# Patient Record
Sex: Female | Born: 1960
Health system: Southern US, Community
[De-identification: ages and names within clinical notes are randomized; demographics above are authoritative.]

## PROBLEM LIST (undated history)

## (undated) ENCOUNTER — Ambulatory Visit: Admission: EM

## (undated) DIAGNOSIS — G4700 Insomnia, unspecified: Secondary | ICD-10-CM

## (undated) DIAGNOSIS — G4733 Obstructive sleep apnea (adult) (pediatric): Secondary | ICD-10-CM

## (undated) DIAGNOSIS — R3129 Other microscopic hematuria: Secondary | ICD-10-CM

## (undated) DIAGNOSIS — E559 Vitamin D deficiency, unspecified: Secondary | ICD-10-CM

## (undated) DIAGNOSIS — E785 Hyperlipidemia, unspecified: Secondary | ICD-10-CM

## (undated) HISTORY — PX: PARTIAL HYSTERECTOMY: SHX80

## (undated) HISTORY — DX: Obstructive sleep apnea (adult) (pediatric): G47.33

## (undated) HISTORY — DX: Insomnia, unspecified: G47.00

## (undated) HISTORY — PX: CYST REMOVAL HAND: SHX6279

## (undated) HISTORY — DX: Hyperlipidemia, unspecified: E78.5

## (undated) HISTORY — DX: Other microscopic hematuria: R31.29

## (undated) HISTORY — DX: Vitamin D deficiency, unspecified: E55.9

---

## 1999-12-16 ENCOUNTER — Other Ambulatory Visit: Admission: RE | Admit: 1999-12-16 | Discharge: 1999-12-16 | Payer: Self-pay | Admitting: Orthopedic Surgery

## 2000-02-18 ENCOUNTER — Ambulatory Visit (HOSPITAL_COMMUNITY): Admission: RE | Admit: 2000-02-18 | Discharge: 2000-02-18 | Payer: Self-pay | Admitting: Family Medicine

## 2000-02-18 ENCOUNTER — Encounter: Payer: Self-pay | Admitting: Family Medicine

## 2001-11-30 ENCOUNTER — Encounter: Payer: Self-pay | Admitting: Emergency Medicine

## 2001-11-30 ENCOUNTER — Emergency Department (HOSPITAL_COMMUNITY): Admission: EM | Admit: 2001-11-30 | Discharge: 2001-11-30 | Payer: Self-pay | Admitting: Emergency Medicine

## 2003-11-20 ENCOUNTER — Encounter (INDEPENDENT_AMBULATORY_CARE_PROVIDER_SITE_OTHER): Payer: Self-pay | Admitting: Specialist

## 2003-11-20 ENCOUNTER — Inpatient Hospital Stay (HOSPITAL_COMMUNITY): Admission: RE | Admit: 2003-11-20 | Discharge: 2003-11-22 | Payer: Self-pay | Admitting: Obstetrics and Gynecology

## 2004-09-23 ENCOUNTER — Other Ambulatory Visit: Admission: RE | Admit: 2004-09-23 | Discharge: 2004-09-23 | Payer: Self-pay | Admitting: Obstetrics and Gynecology

## 2007-06-18 ENCOUNTER — Other Ambulatory Visit: Admission: RE | Admit: 2007-06-18 | Discharge: 2007-06-18 | Payer: Self-pay | Admitting: Obstetrics and Gynecology

## 2008-12-07 ENCOUNTER — Other Ambulatory Visit: Admission: RE | Admit: 2008-12-07 | Discharge: 2008-12-07 | Payer: Self-pay | Admitting: Obstetrics and Gynecology

## 2009-08-19 IMAGING — US MAMMO-LUNI-US
1 series · 14 of 16 positions shown · non-contrast
Comparison: NONE

CLINICAL DATA: [facility]. 

LEFT BREAST MAMMOGRAM ADDITIONAL VIEWS AND LEFT BREAST ULTRASOUND

[Series 1: us left breast · 0.05mm/px · 14 of 16 slices shown]
[im 1/16]
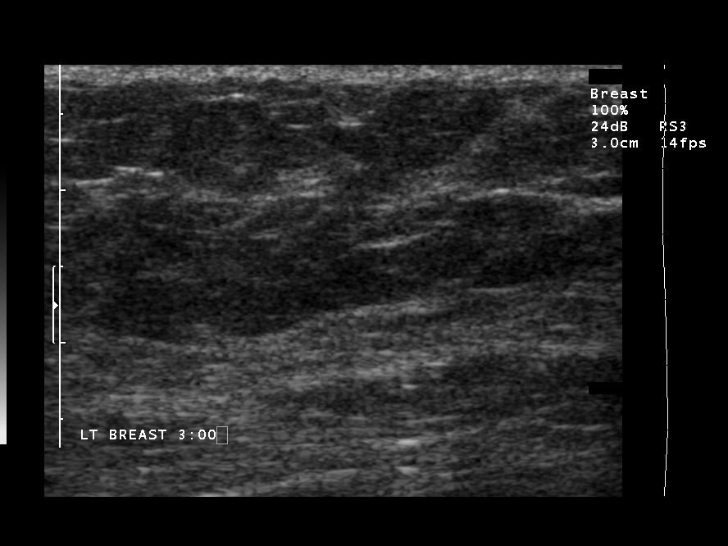
[im 2/16]
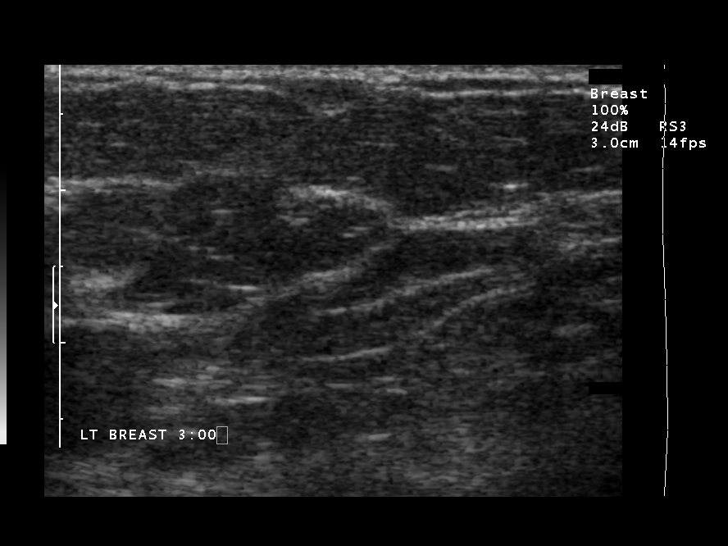
[im 3/16]
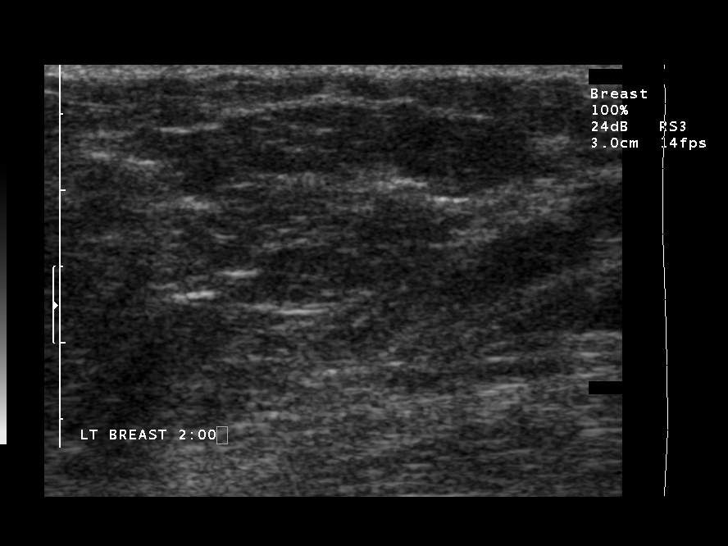
[im 5/16]
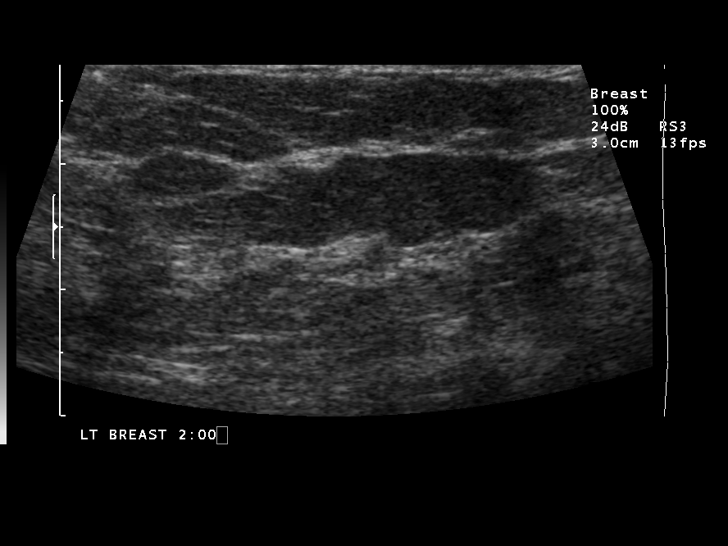
[im 6/16]
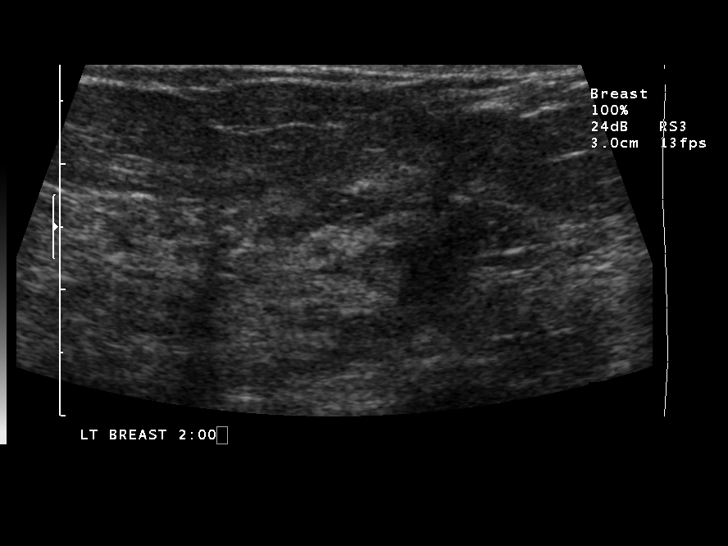
[im 7/16]
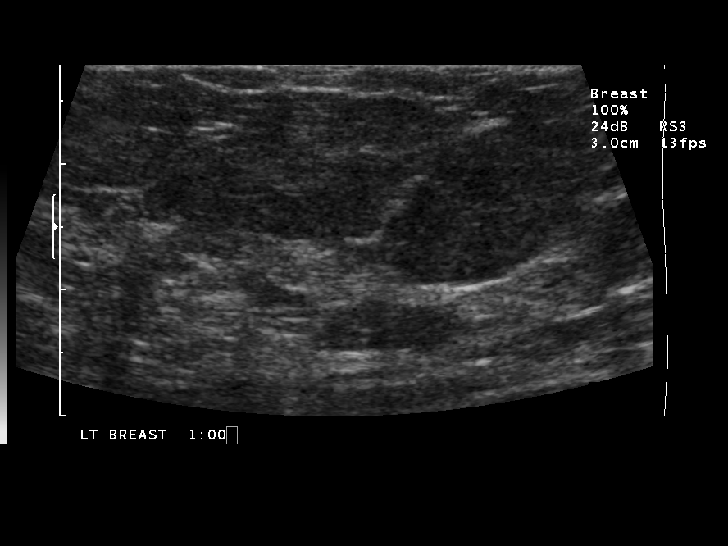
[im 8/16]
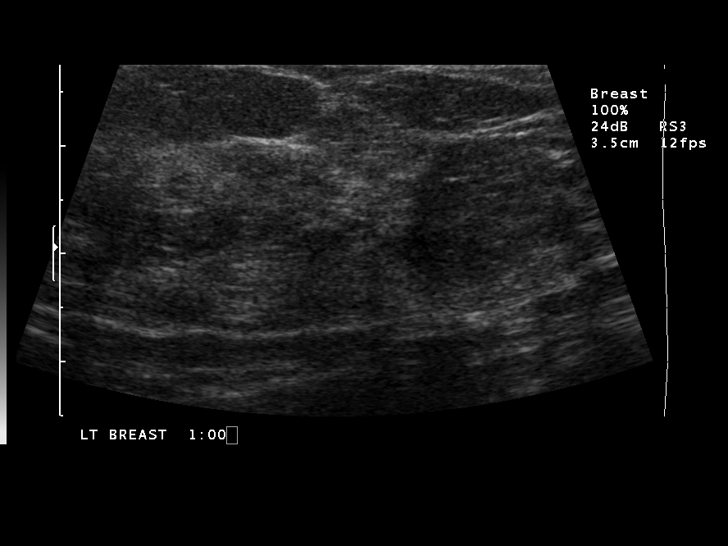
[im 9/16]
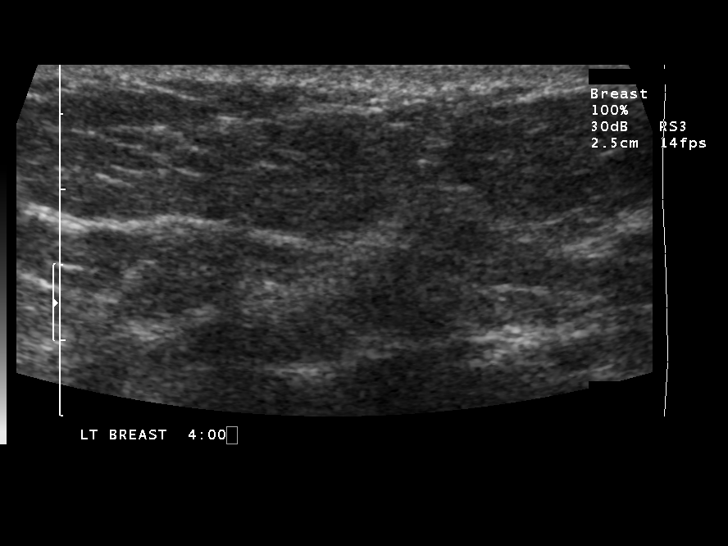
[im 10/16]
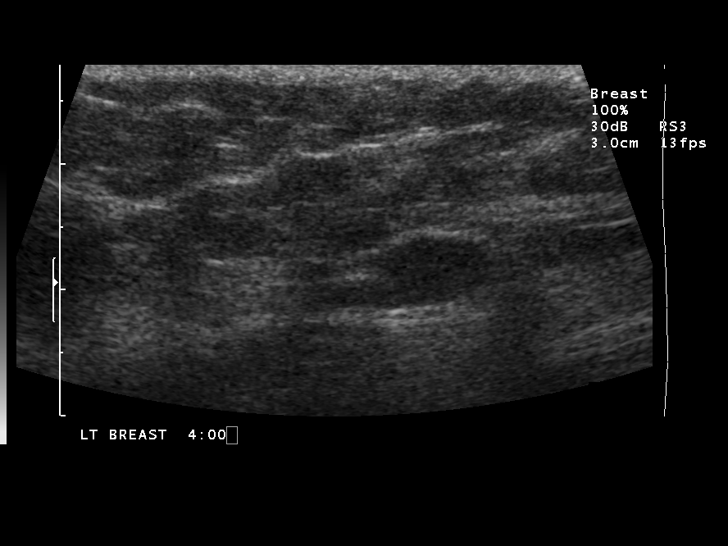
[im 11/16]
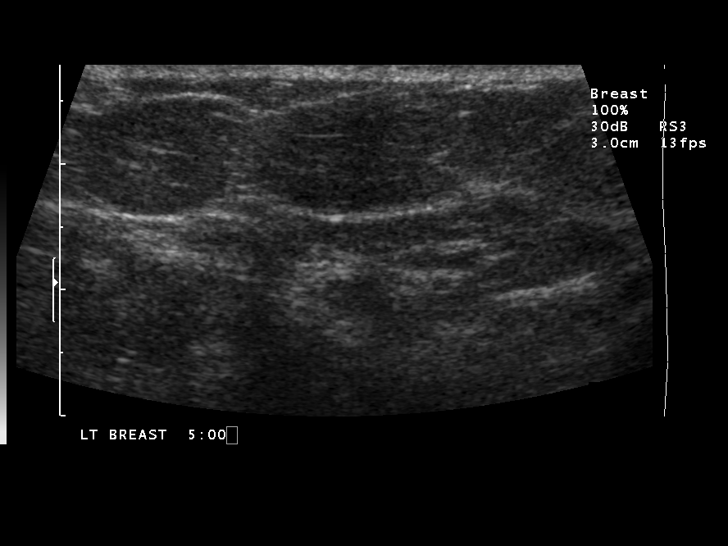
[im 13/16]
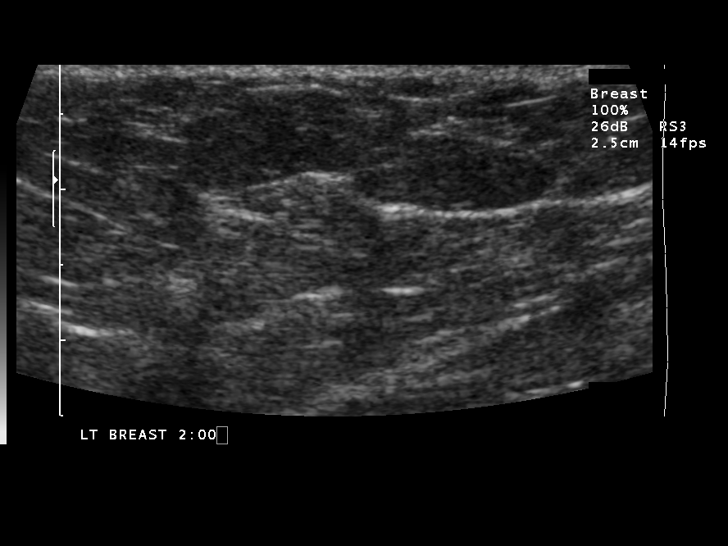
[im 14/16]
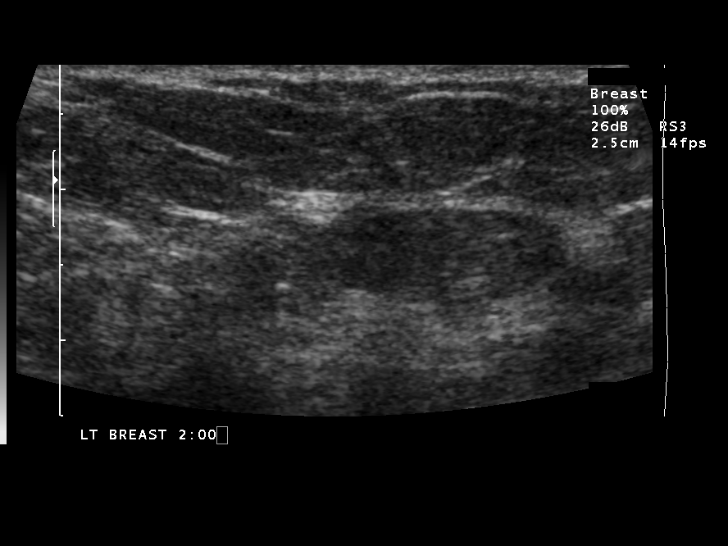
[im 15/16]
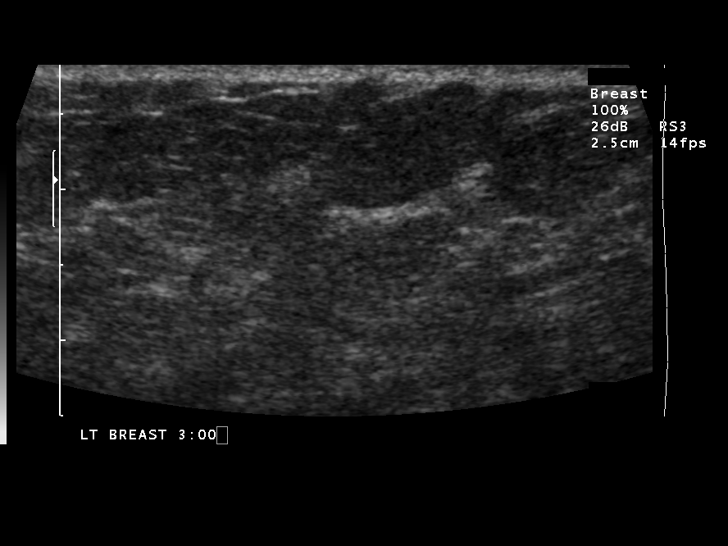
[im 16/16]
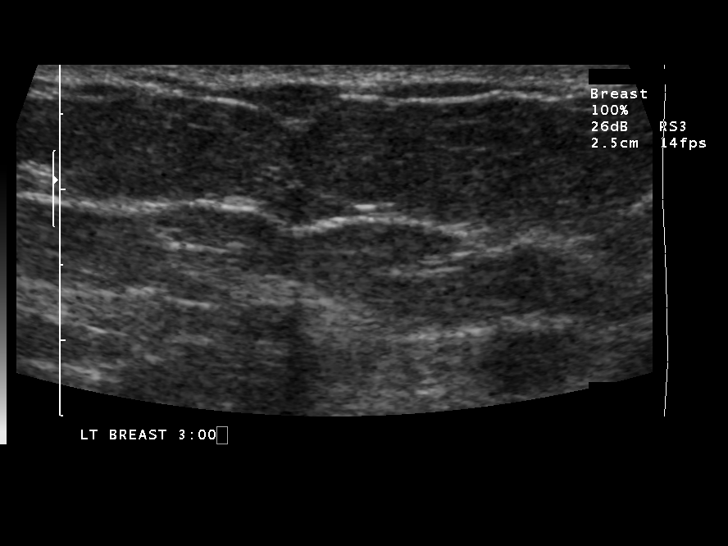

[14 of 16 positions shown; findings below may reference images not displayed]

FINDINGS: Spot craniocaudal, repeat craniocaudal, and repeat 
oblique views of the left breast were performed.  The previously 
noted spiculated nodular density is not present on the additional 
views consistent with summation artifact. Ultrasound evaluation of 
the outer quadrants of the left breast demonstrate a normal 
fibroglandular echo pattern.
IMPRESSION: Additional evaluation of the left breast clears the 
area of concern.  Annual mammographic screening is now 
recommended. The patient was informed at the time of the 
examination of the findings and recommendations by verbal and 
written lay report. Computer assisted (Second Look) technology was 
used as an aid in interpretation of this study. BI-RADS 1: 
Negative. Velezpaduaedwin Luyanda, M.D. electronically reviewed on 
08/18/2007 Dict Date: 08/18/2007  Tran Date: 08/18/2007 [facility]

## 2009-11-12 ENCOUNTER — Other Ambulatory Visit: Admission: RE | Admit: 2009-11-12 | Discharge: 2009-11-12 | Payer: Self-pay | Admitting: Obstetrics and Gynecology

## 2010-11-12 ENCOUNTER — Other Ambulatory Visit (HOSPITAL_COMMUNITY)
Admission: RE | Admit: 2010-11-12 | Discharge: 2010-11-12 | Disposition: A | Discharge status: Home / Self Care | Payer: BC Managed Care – PPO | Source: Ambulatory Visit | Attending: Obstetrics and Gynecology | Admitting: Obstetrics and Gynecology

## 2010-11-12 ENCOUNTER — Other Ambulatory Visit: Payer: Self-pay | Admitting: Obstetrics and Gynecology

## 2010-11-12 DIAGNOSIS — Z01419 Encounter for gynecological examination (general) (routine) without abnormal findings: Secondary | ICD-10-CM | POA: Insufficient documentation

## 2010-12-27 NOTE — Consult Note (Signed)
NAME:  Jasmin Pugh, Jasmin Pugh                          ACCOUNT NO.:  [id number]   MEDICAL RECORD NO.:  [id number]                   PATIENT TYPE:  INP   LOCATION:  9303                                 FACILITY:  [facility]   PHYSICIAN:  Lesleigh Noe, M.D.            DATE OF BIRTH:  [birth year]   DATE OF CONSULTATION:  11/20/2003  DATE OF DISCHARGE:                                   CONSULTATION   INDICATION:  Frequent PVCs.   CONCLUSIONS:  1. Frequent uniform premature ventricular contractions which appear benign     are likely secondary to significant hypokalemia.  2. Hypokalemia, etiology uncertain.  3. Total abdominal hysterectomy.   RECOMMENDATIONS:  1. Repletion of potassium.  2. No further cardiac evaluation unless PVCs persist despite potassium     repletion.   COMMENTS:  The patient is 50 and has no history of cardiovascular disease.  No history of coronary disease.  Her mother who is in her 62s does have a  history of CAD and has had angioplasty.  She is currently status post  hysterectomy today and in recovery was noted to have frequent uniform PVCs.  In speaking with her at this point she has no chest discomfort, shortness of  breath, or other cardiopulmonary symptoms.  She has never had syncope.  No  history of tachypalpitations.  Her laboratory data prior to surgery done on  November 17, 2002 revealed a potassium of 3.9 and all of her other laboratory  data was normal.  Her repeat potassium today demonstrated a level of 3.1, a  magnesium level of 1.5; other laboratory data was normal.   MEDICATIONS:  1. P.r.n. Ambien for sleep.  2. Ortho Evra patch   ALLERGIES:  SULFA.   FAMILY HISTORY:  Positive for coronary artery disease with her mother having  history of angioplasties.  She has three siblings, all in good health.  Her  father is age 45 and has history of hypertension and diabetes.   HABITS:  She does not smoke or drink.   PHYSICAL EXAMINATION:  GENERAL:  The patient  is still groggy from  anesthesia.  VITAL SIGNS:  Blood pressure is 110/70, heart rate is 80.  Monitoring  reveals normal sinus rhythm.  A rare PVC is noted.  NECK:  No JVD is noted.  Thyroid is not palpable.  LUNGS:  Clear to auscultation and percussion.  CARDIAC:  No murmur, no gallop, no click, no rub.  EXTREMITIES:  Reveal no edema.   EKG reveals nonspecific T wave flattening, PVCs that are uniform.  Potassium  3.1, magnesium 1.5.   DISCUSSION:  I believe this is as simple as the patient's potassium level  being low.  This needs to be repleted.  I do not feel that she needs to have  telemetry monitoring.  Will give 40 mEq of K-Dur now, repeat it in about 4  hours.  Repeat potassium later  this evening.  Get EKG in the morning.  No  further cardiac evaluation seems indicated.                                               Lesleigh Noe, M.D.    HWS/MEDQ  D:  11/20/2003  T:  11/20/2003  Job:  [id number]

## 2010-12-27 NOTE — Op Note (Signed)
NAME:  Jasmin Pugh, Jasmin Pugh                          ACCOUNT NO.:  [id number]   MEDICAL RECORD NO.:  [id number]                   PATIENT TYPE:  INP   LOCATION:  9399                                 FACILITY:  [facility]   PHYSICIAN:  Artist Pais, M.D.                 DATE OF BIRTH:  [birth year]   DATE OF PROCEDURE:  11/20/2003  DATE OF DISCHARGE:                                 OPERATIVE REPORT   PREOPERATIVE DIAGNOSIS:  Enlarging fibroid uterus despite treatment with  Ortho-Evra patch.   POSTOPERATIVE DIAGNOSES:  1. Enlarging fibroid uterus despite treatment with Ortho-Evra patch.  2. Left bowel adherent to the left ovary.   PROCEDURES:  1. Total abdominal hysterectomy.  2. Lysis of adhesions.   SURGEON:  Artist Pais, M.D.   ASSISTANT:  Bing Neighbors. Sydnee Cabal, M.D.   Arline AspLurline Idol.   FLUIDS REPLACED:  2200 mL crystalloid plus 2 g of IV Cefazolin  preoperatively.   ESTIMATED BLOOD LOSS:  250 mL.   DRAINS:  Foley.   COMPLICATIONS:  None.   DESCRIPTION OPERATION:  The patient was brought to the operating room,  identified on the operating table.  After induction of adequate general  endotracheal anesthesia, the patient was prepped and draped in the usual  sterile fashion.  An examination under anesthesia revealed a fibroid to be  palpable posteriorly and the uterus and fibroid together palpated to be  approximately 12-14 weeks' size.  It was felt that a Pfannenstiel incision  would provide adequate room to perform the hysterectomy.  A Foley catheter  had been placed.  A Pfannenstiel incision was made and carried down to  fascia.  The fascia was scored in the midline, extended bilaterally using  Mayo scissors.  It was then separated free from the underlying muscles.  The  muscles were separated in the midline down to the symphysis.  The patient  was placed in Trendelenburg and the peritoneum was entered sharply and  carefully, taking care to avoid bowel or other abdominal  contents.  The  peritoneal incision was extended superiorly, then inferiorly down to the  bladder edge.  Examination of the peritoneal contents revealed there to be  no enlarged pelvic or periaortic nodes.  There was noted to be some adhesive  disease of the left colon to the left ovary and infundibulopelvic ligament.  Subsequently an O'Connor-O'Sullivan was placed and the bladder blade was  placed.  Using two bowel packs, the bowel was packed out of the operative  field and the abdominal wall retractor was placed.  Two long Kellys were  placed at either angle of the uterus.  The large pedunculated fibroid was  noted to emanate off the left posterior superior portion of the uterus at  the fundal portion.  It was on a very vascular and large stalk and was  adjacent to the left tube and ovary.  The right round ligament was  subsequently identified, ligated with a figure-of-eight suture of 0  Monocryl, and divided using cautery.  The anterior leaf of the broad  ligament was divided to the area overlying the internal os.  A clear space  was developed and the fallopian tube and infundibulopelvic ligament were  clamped with a Heaney clamp, cut, and suture ligated x2 with two sutures of  0 Monocryl.  Excellent hemostasis was noted at this pedicle.  It had been  noted that both ovaries appeared normal and tubes appeared normal as well.  In a similar fashion the left round ligament was ligated with a figure-of-  eight suture of 0 Monocryl, divided with cautery, and the anterior leaf of  the broad ligament was divided using Metzenbaum scissors to the area  overlying the internal os.  The clear space was developed and the fallopian  tube and utero-ovarian ligament were clamped, cut, and doubly suture ligated  with sutures of 0 Monocryl.  Subsequently the vessels were skeletonized on  the right.  The bladder flap was mobilized and taken down using sharp and  blunt dissection.  It was noted to be  well-mobilized.  After the vessels on  the right were mobilized, the uterine vessels were clamped with a curved  Heaney, with another Heaney used for backbleeding, cut and suture ligated  using a figure-of-eight suture of 0 Monocryl.  The same was accomplished on  the left in a similar fashion.  Subsequent cardinal-uterosacral ligament  bites were taken bilaterally with straight Heaneys, cut and suture ligated  using sutures of 0 Monocryl.  Again the bladder flap was noted to be well-  mobilized.  Using a curved Heaney the left uterosacral ligament was clamped,  cut, and suture ligated using a suture of 0 Monocryl, and the vagina was  entered.  Subsequently Satinsky scissors were used to separate the cervix  from the vagina and Kocher clamps were placed on the vagina.  An angle  suture was placed at the right and left angles of the vaginal cuff, and this  was done in multiple throws to promote hemostasis.  Figure-of-eight sutures  of 0 Monocryl were then placed to close the cuff, taking great care to  ensure that all of the mucosa was included in the closure.  Subsequently the  pelvis was copiously irrigated with warm lactated Ringer's.  We were able to  see both ureters peristalsing normally.  The right ovary was suspended using  a suture of 2-0 Monocryl back to the round ligament to decrease the risk of  dyspareunia later.  The left ovary was also suspended in a similar fashion,  but this was after the bowel adhesions to the left ovary and left  infundibulopelvic ligament were taken down.  There was noted to be some  bleeding at this area, but this was clamped with a right angle and tied with  a free tie of 2-0 Monocryl.  Excellent hemostasis was noted where the  adhesions had been lysed.  The adhesion had to be lysed because there was a  portal which could have entrapped one of the bowel loops, thus causing a bowel obstruction, so we had to go ahead and lyse this adhesion.  There was   noted to be a small amount of oozing at the right ovarian ligament  suspension, and I clamped this with a right angle and then tied this with a  free tie of 2-0 Monocryl.  Subsequently the entire pelvis was copiously  irrigated with excellent hemostasis  noted at all the pedicle sites; however,  we did note a small amount of oozing near the vaginal cuff, but retraction  of the bladder flap reveals this to be over the adventitial tissue and  excellent hemostasis of this area was achieved using cautery.  It should be  noted that all cut pedicles and raw surfaces were very carefully inspected  and when we began to close the peritoneum, there was absolutely no oozing at  all.  Subsequently the patient was taken out of Trendelenburg, the bowel  packs were removed, the O'Connor-O'Sullivan retractor as well as the  abdominal wall retractor and the bladder blade were removed.  Hemostats were  placed on the peritoneum and the peritoneum was closed using a simple  running suture of 2-0 Monocryl.  There was noted to be some oozing of a  perforator of the fascia, but I just locked the suture and this bleeding was  subsequently controlled.  The fascia was then irrigated copiously with warm  lactated Ringer's and excellent hemostasis was achieved using cautery.  There were noted to be absolutely no subfascial bleeders.  The fascia was  closed using two sutures of 0 PDS with each suture anchored at either angle  of the incision and run to the midline and tied.  The subcutaneous tissue  was irrigated copiously with warm lactated Ringer's and excellent hemostasis  was achieved using cautery.  The skin was closed with staples.  The patient  tolerated the procedure well without apparent complications and was  transferred to the recovery room in stable condition after all instrument,  sponge, and needle counts were correct.  It should be noted that when we  lysed the adhesion of the bowel, we did this along the  adhesion and not near  the bowel viscus at all, and the bowel was inspected and noted to have not  been in any way compromised and appeared totally and completely normal.  This was just done for completeness' sake.                                               Artist Pais, M.D.    DC/MEDQ  D:  11/20/2003  T:  11/20/2003  Job:  [id number]   cc:   Sharlet Salina, M.D.  [address]  [place]  [place] [address]  Fax: [phone]

## 2010-12-27 NOTE — H&P (Signed)
NAME:  Jasmin Pugh, Jasmin Pugh                          ACCOUNT NO.:  [id number]   MEDICAL RECORD NO.:  [id number]                   PATIENT TYPE:  INP   LOCATION:  NA                                   [facility]   PHYSICIAN:  Artist Pais, M.D.                 DATE OF BIRTH:  11/02/1960   DATE OF ADMISSION:  11/20/2003  DATE OF DISCHARGE:                                HISTORY & PHYSICAL   HISTORY OF PRESENT ILLNESS:  The patient is a 50 year old African American  female, para 0, admitted for a total abdominal hysterectomy.  The patient  was originally referred by Sharlet Salina, M.D., for GYN care and for  followup of a fibroid uterus.  She has been followed since May of 2002 for  her fibroid uterus.  Originally at that time, her largest fibroid was found  to measure 5.1 x 3.5 x 5.6.  She has been managed expectantly, being treated  with Ortho Evra patches to decrease the risk of growth of the fibroid.  With  respect to her most recent pelvic ultrasound, she was found to have some new  fibroids.  The uterus measured 7.2 x 4.0 x 4.5 cm.  She, however, was found  to have a growth in her pedunculated fibroid such that it measures 61 x 51 x  68 mm.  On May 30, 2003, it measured 61 x 59 mm and on February 21, 2002, it  measured 5.2 x 4.1 x 5.1 cm.  So the most recent measurements show that this  has increased in size from 5.2 x 4.1 x 5.1 initially to 6.1 x 5.1 x 6.8 cm.  I explained to the patient that this is concerning from some sort of  sarcomatous change, although it is not growing rapidly.  I do not think it  is a sarcoma, but an indication for hysterectomy with respect to fibroid  uterus with the continue growth of the fibroid despite suppression of the  hypothalamic, pituitary, and ovarian axis with Ortho Eva patches.  The  patient has no plans for children and thus is amenable with hysterectomy.  I  could offer her no guarantees that I would be able to do a myomectomy  without  compromising the uterine blood supply or the integrity of the uterus  and I might not be able to leave her with an intact uterus.  I did offer her  to be watched for four more months, but I really thought the prudent course  would be to proceed with hysterectomy as she has been managed expectantly  since 2002.  The patient was allowed to think about her options and decided  to undergo hysterectomy.  She does desire to keep her ovaries if they look  normal and we agreed that we would like them in situ if they appear normal.  However, if they appear abnormal, I would go ahead  and remove them, but  agree that at age 45 we would like to not oophorectomize her unless  absolutely necessary.  She does understand if I remove her ovaries that she  would become instantly menopausal, a condition which we can treat with  hormone replacement therapy and if we remove her ovaries it does decrease,  but does not eliminate her risk of developing ovarian cancer.  After  discussion of the risks versus benefits, she does desire to keep her  ovaries.  The risks of surgery, including anesthetic complication,  hemorrhage, infection, and damage to adjacent structures, including bladder,  bowel, blood vessel, and ureter, were discussed with the patient.  She was  made aware that the postoperative risks include urinary tract infection,  atelectasis, wound infections, or DVT which could result in a stroke or  pulmonary embolism.  She understands that there could also be unforeseen  risks.  I did explain to her that a fibroid uterus very dramatically  distorts the anatomy in the pelvis and there is I think always an increased  risk of damage to the ureter, the bladder, the bowel, or any other adjacent  structures.  I did spent a considerable amount of time explaining the  distortion of the anatomy by the fibroid uterus and explained if  complication were to occur it would likely be due to that reason.  The  patient  expressed and understanding of and acceptance of her risks and  desires to proceed with surgery.  The patient has discontinued her Ortho  Evra patch and has not taken any aspirin or any NSAID-type medications for  the last two weeks.   OBSTETRICAL AND GYNECOLOGICAL HISTORY:  History of fibroid uterus as above.  The patient does have cycles every eight weeks as she does back-to-back  patches.  She did have an abnormal Pap and cryosurgery in 1995.  Paps have  been normal since that time.  In fact, her most recent Pap on January 26, 2003,  was within normal limits.   PAST MEDICAL HISTORY:  History of seasonal allergies.   PAST SURGICAL HISTORY:  1. Knee surgery, that is repair of ACL.  2. Removal of ganglion cyst x 2.   MEDICATIONS:  Ambien and Ortho Evra patch.  The Ortho Evra patch has been  discontinued for the last two weeks.   ALLERGIES:  SULFA causes rash, fever, diarrhea, and hives.   FAMILY HISTORY:  There is no family history of breast, ovarian, or prostate  cancer.  The patient's father is 39 with hypertension and diabetes.  Her  mother is 49 with a history of two angioplasties.  She has three siblings,  ages 47, 21, and 52, all alive and well.  She has no children.   SOCIAL HISTORY:  The patient works for [employer].  She does not smoke  or drink.   REVIEW OF SYSTEMS:  Noncontributory, except as noted above.  Denies  headache, visual changes, chest pain, shortness of breath, abdominal pain,  change in bowel habits, unintentional weight loss, dysuria, urgency,  frequency, vaginal pruritus or discharge, and pain or bleeding with  intercourse.   PHYSICAL EXAMINATION:  GENERAL APPEARANCE:  A well-developed African  American female.  VITAL SIGNS:  Blood pressure 102/76, heart rate 80.  WEIGHT:  144 pounds.  HEENT:  Normal.  NECK:  Supple without thyromegaly, adenopathy, or nodules.  CHEST:  Clear to auscultation. BREASTS:  Symmetrical without masses, dimpling,  retraction, or nipple  discharge.  CARDIAC:  Regular rate and rhythm without extra sounds or murmurs.  ABDOMEN:  Soft and nontender.  No hepatosplenomegaly or masses.  EXTREMITIES:  No cyanosis, clubbing, or edema.  NEUROLOGIC:  Oriented x 3.  Grossly normal.  PELVIC:  Normal female external genitalia.  No vulvar, vaginal, or cervical  lesions.  Bimanual examination reveals the uterus to be retroverted and I am  able to palpate her large fibroid posteriorly.  The uterus in its entirety  palpates to be a 12-14 week size.  No adnexal masses palpated.  RECTAL:  Excellent sphincter tone.  Confirms pelvic exam.  No masses  palpated.   IMPRESSION AND PLAN:  The patient is a 50 year old African American female  with a pedunculated fibroid, as well as other fibroid, admitted for  hysterectomy due to enlargement of her pedunculated fibroid.  She has been  managed expectantly since 2002 and ultrasounds have revealed no  hydronephrosis.  However, due to the increased size of her fibroid, the  patient is admitted for a total abdominal hysterectomy.  The risks have been  explained to the patient and she expresses understanding of and acceptance  of these risks.  Of note, I have received the patient's laboratory studies,  which reveal a negative urine pregnancy test, negative urinalysis with the  exception of a small amount of urine hemoglobin.  Her sodium is 136,  potassium 3.9, chloride 101, glucose 88, BUN 10, and creatinine 0.8.  The PT  is 12.5 and the PTT is 31.  We did do a whole clot due to her fibroid  uterus.  Her white count is 6900 and the hemoglobin is 12.1 with a platelet  count of 246,000.                                               Artist Pais, M.D.    DC/MEDQ  D:  11/19/2003  T:  11/19/2003  Job:  [id number]

## 2010-12-27 NOTE — Discharge Summary (Signed)
NAME:  Jasmin Pugh, Jasmin Pugh                          ACCOUNT NO.:  [id number]   MEDICAL RECORD NO.:  [id number]                   PATIENT TYPE:  INP   LOCATION:  9303                                 FACILITY:  [facility]   PHYSICIAN:  Artist Pais, M.D.                 DATE OF BIRTH:  [birth year]   DATE OF ADMISSION:  11/20/2003  DATE OF DISCHARGE:  11/22/2003                                 DISCHARGE SUMMARY   HISTORY OF PRESENT ILLNESS:  The patient is a 51 year old African-American  female para 0 admitted for a total abdominal hysterectomy due to increasing  fibroid.  Initially, the pedunculated fibroid measured 5.2 x 4.1 x 5.1 cm  and subsequently increased in size over a period of a year-and-a-half to 6.1  x 5.1 x 6.8 cm.  For further details please see the dictated History and  Physical.   HOSPITAL COURSE:  The patient was admitted and subsequently underwent a  total abdominal hysterectomy.  Tubes and ovaries appeared normal.  For  further details please see the operative report.  Postoperatively, she was  found to have some ectopy in the recovery room and we did check her  potassium.  She was found to be hypokalemic with a potassium of 3.1.  She  was seen by Dr. Garnette Scheuermann in cardiology who really thought the etiology was  hypokalemia and her potassium was repleted with resolution of her ectopy.  On the night of postoperative day #0 she was noted to have minimal bleeding  and also to be receiving excellent analgesia.  Dressing was noted to be  clean, dry, and intact.  No etiology was found for the hypokalemia.  She did  not have any nausea and vomiting the weekend prior to the surgery.  On  postoperative day #1 her hemoglobin was noted to be 9.3 with a white count  of 11,400.  Repeat potassium was found to be normal at 4.2 after repletion.  On postoperative day #2 the patient had passed flatus, was tolerating a  regular diet.  Incision was noted to be clean, dry, and intact.  There was  no  CVA tenderness and no calf tenderness.  Her hemoglobin improved to 10.5.  She was given extensive discharge instructions and a prescription for  Percocet dispense #30 one to two p.o. q.3-4h. p.r.n. pain.  She will take  Aleve per pharmacy directions and she was urged to return to the office in a  few days on postoperative day #7 to have her staples removed.  She was also  urged to call with any problems and was given written discharge  instructions.  She was also given the Recovering From Your Surgery booklet  as well.  She was urged to keep her incision clean, dry, and intact and call  for any problems.  Her pathology returned as endometrial polyp with simple  hyperplasia without atypia, no atypical features or  carcinoma identified;  nonpolypoid endometrium as well; adenomyosis; and a 6 x 6 x 5.5 pedunculated  subserosal leiomyoma of the left cornua.  The patient will return in a few  days for staple removal.                                               Artist Pais, M.D.    DC/MEDQ  D:  01/03/2004  T:  01/04/2004  Job:  [id number]   cc:   Sharlet Salina, M.D.  [address]  [place]  [place] [address]  Fax: [phone]

## 2011-07-07 DIAGNOSIS — N94819 Vulvodynia, unspecified: Secondary | ICD-10-CM | POA: Insufficient documentation

## 2011-07-07 DIAGNOSIS — R102 Pelvic and perineal pain: Secondary | ICD-10-CM | POA: Insufficient documentation

## 2011-07-07 DIAGNOSIS — N3010 Interstitial cystitis (chronic) without hematuria: Secondary | ICD-10-CM | POA: Insufficient documentation

## 2011-07-07 DIAGNOSIS — R109 Unspecified abdominal pain: Secondary | ICD-10-CM | POA: Insufficient documentation

## 2013-05-07 ENCOUNTER — Other Ambulatory Visit: Payer: Self-pay | Admitting: Cardiology

## 2013-05-07 DIAGNOSIS — Z79899 Other long term (current) drug therapy: Secondary | ICD-10-CM

## 2013-05-07 DIAGNOSIS — E7800 Pure hypercholesterolemia, unspecified: Secondary | ICD-10-CM

## 2013-05-24 ENCOUNTER — Other Ambulatory Visit (INDEPENDENT_AMBULATORY_CARE_PROVIDER_SITE_OTHER): Payer: BC Managed Care – PPO

## 2013-05-24 DIAGNOSIS — E7800 Pure hypercholesterolemia, unspecified: Secondary | ICD-10-CM

## 2013-05-24 DIAGNOSIS — Z79899 Other long term (current) drug therapy: Secondary | ICD-10-CM

## 2013-05-24 LAB — ALT: ALT: 18 U/L (ref 0–35)

## 2013-05-25 LAB — NMR LIPOPROFILE WITH LIPIDS
Cholesterol, Total: 152 mg/dL (ref <200)
HDL Particle Number: 33.1 umol/L (ref >=30.5)
HDL Size: 9.1 nm — ABNORMAL LOW (ref >=9.2)
HDL-C: 53 mg/dL (ref >=40)
LDL (calc): 81 mg/dL (ref <100)
LDL Particle Number: 1111 nmol/L — ABNORMAL HIGH (ref <1000)
LDL Size: 21.2 nm (ref >20.5)
LP-IR Score: 25 (ref <=45)
Large HDL-P: 7.7 umol/L (ref >=4.8)
Large VLDL-P: 1.5 nmol/L (ref <=2.7)
Small LDL Particle Number: 539 nmol/L — ABNORMAL HIGH (ref <=527)
Triglycerides: 88 mg/dL (ref <150)
VLDL Size: 41.1 nm (ref <=46.6)

## 2013-05-27 ENCOUNTER — Telehealth: Payer: Self-pay | Admitting: Cardiology

## 2014-03-23 ENCOUNTER — Ambulatory Visit (INDEPENDENT_AMBULATORY_CARE_PROVIDER_SITE_OTHER): Payer: BC Managed Care – PPO | Admitting: Cardiology

## 2014-03-23 ENCOUNTER — Encounter: Payer: Self-pay | Admitting: Cardiology

## 2014-03-23 VITALS — BP 102/80 | HR 65 | Ht 59.0 in | Wt 146.8 lb

## 2014-03-23 DIAGNOSIS — R002 Palpitations: Secondary | ICD-10-CM

## 2014-03-23 DIAGNOSIS — Z8249 Family history of ischemic heart disease and other diseases of the circulatory system: Secondary | ICD-10-CM

## 2014-03-23 DIAGNOSIS — R252 Cramp and spasm: Secondary | ICD-10-CM

## 2014-03-23 DIAGNOSIS — E7800 Pure hypercholesterolemia, unspecified: Secondary | ICD-10-CM

## 2014-03-23 NOTE — Patient Instructions (Signed)
The current medical regimen is effective;  continue present plan and medications.  Follow up in 1 year with Dr Skains.  You will receive a letter in the mail 2 months before you are due.  Please call us when you receive this letter to schedule your follow up appointment.  

## 2014-03-23 NOTE — Progress Notes (Signed)
[place]. [address] [place], [place]  [address] Phone: [phone] Fax:  [phone]  Date:  03/23/2014   ID:  Gypsy, Kellogg 30-Sep-1960, MRN [id number]  PCP:  Reginia Naas, MD   History of Present Illness: Jasmin Pugh is a 53 y.o. female with mild obstructive sleep apnea here for follow up of palpitations. Her mother has had 4 angioplasties and her father died of myocardial infarction at age 12. She also has a maternal uncle who died of an MI in his late 50s. Her stress level has been increased secondary to layoffs. An EKG was performed on 03/18/10 which showed ventricular bigeminy.  She ended up undergoing a Holter monitor 8/11 which showed frequent PVCs, 3000, an echocardiogram showed normal EF with trace mitral regurgitation and an exercise tolerance test showed 7 minutes and 17 seconds of exercise. Because of her palpitations/PVCs, metoprolol was started but subsequently, she was doing well and we decided to discontinue the metoprolol. She has been doing well since then.  In regards to her cholesterol, she is on pravastatin. [employer], [place].D. is assisting with management. NMR.  Leg cramps have improved. Been taking over the counter KCL. Gatoraide. She has battled with toe cramps most of her life but now leg cramps. It occurred at the movies when she crossed her legs. She drinks Gatorade to try to help with this. She is on pravastatin but denies any muscle weakness or pain. She has not tolerated several of the statins.    Wt Readings from Last 3 Encounters:  03/23/14 146 lb 12.8 oz (66.588 kg)     Past Medical History  Diagnosis Date  . Vitamin D deficiency disease   . Insomnia   . Obstructive sleep apnea     mild   . Microscopic hematuria     complete work up neg. for malignancy 06/2011  . Hyperlipidemia     Past Surgical History  Procedure Laterality Date  . Cyst removal hand      02/2000  . Partial hysterectomy      Current  Outpatient Prescriptions  Medication Sig Dispense Refill  . calcium-vitamin D 250-100 MG-UNIT per tablet Take 1200 mg as directed      . Coenzyme Q10 (CO Q 10 PO) Take by mouth. 1 capsule with a meal once a day      . estrogens, conjugated, (PREMARIN) 0.625 MG tablet Take 0.625 mg by mouth 2 (two) times a week. Twice a week      . ferrous sulfate 325 (65 FE) MG tablet Take 325 mg by mouth daily with breakfast.      . Magnesium 250 MG TABS Take by mouth. 1 tablet meal once a day      . Multiple Vitamin (MULTIVITAMIN) tablet Take 1 tablet by mouth daily.      Marland Kitchen POTASSIUM PO Take by mouth.      . pravastatin (PRAVACHOL) 40 MG tablet Take 40 mg by mouth daily.      Marland Kitchen zolpidem (AMBIEN CR) 12.5 MG CR tablet Take 12.5 mg by mouth at bedtime as needed for sleep.       No current facility-administered medications for this visit.    Allergies:    Allergies  Allergen Reactions  . Sulfa Antibiotics   . Sulfur     Other reaction(s): Fever Hives, nausea, vomiting    Social History:  The patient  reports that she has never smoked. She does not have any  smokeless tobacco history on file. She reports that she does not drink alcohol or use illicit drugs. Her sister left her husband and she is living with her. This is providing stressed.  Works at [employer].   Family History  Problem Relation Age of Onset  . Hyperlipidemia Mother     heart issues with 2 angioplasty and high cholesterol      . Diabetes Father     poor circulation  . Hypertension Father   . Heart attack Father   . Colon cancer Maternal Uncle   . Colon cancer Paternal Uncle     x 2 with colon cancer  . Hypertension Sister     ROS:  Please see the history of present illness.   Denies any myalgias, fevers, chills, orthopnea, PND, chest. Positive for insomnia.    All other systems reviewed and negative.   PHYSICAL EXAM: VS:  BP 102/80  Pulse 65  Ht 4\' 11"  (1.499 m)  Wt 146 lb 12.8 oz (66.588 kg)  BMI 29.63 kg/m2 Well  nourished, well developed, in no acute distress HEENT: normal, [place]/AT, EOMI Neck: no JVD, normal carotid upstroke, no bruit Cardiac:  normal S1, S2; RRR; no murmur Lungs:  clear to auscultation bilaterally, no wheezing, rhonchi or rales Abd: soft, nontender, no hepatomegaly, no bruits Ext: no edema, 2+ distal pulses Skin: warm and dry GU: deferred Neuro: no focal abnormalities noted, AAO x 3  EKG:  03/23/14-sinus rhythm, 65, no other abnormalities    ASSESSMENT AND PLAN:  1. Leg cramps-improved. Potassium, magnesium, hydration. 2. Hyperlipidemia-pravastatin. Doing well. Appreciate assistance from [employer], Pharm.D. Lipid panel reviewed with her. 3. Family history of CAD-as described above. Mother with angioplasty. 4. Insomnia-Ambien.  Signed, Candee Furbish, MD [facility]  03/23/2014 3:46 PM

## 2014-04-07 ENCOUNTER — Other Ambulatory Visit: Payer: Self-pay | Admitting: *Deleted

## 2014-04-07 MED ORDER — PRAVASTATIN SODIUM 40 MG PO TABS: 40.0000 mg | ORAL_TABLET | Freq: Every day | ORAL | Status: DC

## 2015-04-05 ENCOUNTER — Ambulatory Visit (INDEPENDENT_AMBULATORY_CARE_PROVIDER_SITE_OTHER): Payer: 59 | Admitting: Cardiology

## 2015-04-05 ENCOUNTER — Encounter: Payer: Self-pay | Admitting: Cardiology

## 2015-04-05 VITALS — BP 112/66 | HR 70 | Ht 59.0 in | Wt 149.0 lb

## 2015-04-05 DIAGNOSIS — R002 Palpitations: Secondary | ICD-10-CM | POA: Diagnosis not present

## 2015-04-05 DIAGNOSIS — R252 Cramp and spasm: Secondary | ICD-10-CM

## 2015-04-05 DIAGNOSIS — E785 Hyperlipidemia, unspecified: Secondary | ICD-10-CM | POA: Insufficient documentation

## 2015-04-05 DIAGNOSIS — I493 Ventricular premature depolarization: Secondary | ICD-10-CM | POA: Diagnosis not present

## 2015-04-05 DIAGNOSIS — G4700 Insomnia, unspecified: Secondary | ICD-10-CM | POA: Diagnosis not present

## 2015-04-05 LAB — LIPID PANEL
CHOL/HDL RATIO: 4
Cholesterol: 169 mg/dL (ref 0–200)
HDL: 43.8 mg/dL (ref >39.00)
LDL Cholesterol: 100 mg/dL — ABNORMAL HIGH (ref 0–99)
NONHDL: 125.11
Triglycerides: 124 mg/dL (ref 0.0–149.0)
VLDL: 24.8 mg/dL (ref 0.0–40.0)

## 2015-04-05 NOTE — Addendum Note (Signed)
Addended by: Domenica Reamer R on: 04/05/2015 09:08 AM   Modules accepted: Orders

## 2015-04-05 NOTE — Progress Notes (Signed)
[place]. [address] [place],   [address] Phone: [phone] Fax:  [phone]  Date:  04/05/2015   ID:  Jasmin Pugh, Jasmin Pugh September 06, 1960, MRN [id number]  PCP:  Reginia Naas, MD   History of Present Illness: Jasmin Pugh is a 54 y.o. female with mild obstructive sleep apnea here for follow up of palpitations. Her mother has had 4 angioplasties and her father died of myocardial infarction at age 42. She also has a maternal uncle who died of an MI in his late 59s. An EKG was performed on 03/18/10 which showed ventricular bigeminy.  She ended up undergoing a Holter monitor 8/11 which showed frequent PVCs, 3000, an echocardiogram showed normal EF with trace mitral regurgitation and an exercise tolerance test showed 7 minutes and 17 seconds of exercise. Because of her palpitations/PVCs, metoprolol was started but subsequently, she was doing well and we decided to discontinue the metoprolol. She has been doing well since then.  In regards to her cholesterol, she is on pravastatin.   Leg cramps have improved. Been taking over the counter KCL. Gatoraide. She has battled with toe cramps most of her life but now leg cramps. It occurred at the movies when she crossed her legs. She drinks Gatorade to try to help with this. She is on pravastatin but denies any muscle weakness or pain. She has not tolerated several of the statins.  Overall she is quite pleased and doing very well. No chest pain, no syncope, no orthopnea, no PND.    Wt Readings from Last 3 Encounters:  04/05/15 149 lb (67.586 kg)  03/23/14 146 lb 12.8 oz (66.588 kg)     Past Medical History  Diagnosis Date  . Vitamin D deficiency disease   . Insomnia   . Obstructive sleep apnea     mild   . Microscopic hematuria     complete work up neg. for malignancy 06/2011  . Hyperlipidemia     Past Surgical History  Procedure Laterality Date  . Cyst removal hand      02/2000  . Partial hysterectomy       Current Outpatient Prescriptions  Medication Sig Dispense Refill  . calcium-vitamin D 250-100 MG-UNIT per tablet Take 1200 mg as directed    . Coenzyme Q10 (CO Q 10 PO) Take by mouth. 1 capsule with a meal once a day    . estrogens, conjugated, (PREMARIN) 0.625 MG tablet Take 0.625 mg by mouth 2 (two) times a week. Twice a week    . ferrous sulfate 325 (65 FE) MG tablet Take 325 mg by mouth daily with breakfast.    . Magnesium 250 MG TABS Take by mouth. 1 tablet meal once a day    . Multiple Vitamin (MULTIVITAMIN) tablet Take 1 tablet by mouth daily.    Marland Kitchen POTASSIUM PO Take by mouth.    . pravastatin (PRAVACHOL) 40 MG tablet Take 1 tablet (40 mg total) by mouth daily. 90 tablet 3  . VAGIFEM 10 MCG TABS vaginal tablet Place 1 tablet vaginally 2 (two) times a week.    . zolpidem (AMBIEN) 10 MG tablet Take 1 tablet by mouth daily.     No current facility-administered medications for this visit.    Allergies:    Allergies  Allergen Reactions  . Sulfa Antibiotics   . Sulfur     Other reaction(s): Fever Hives, nausea, vomiting    Social History:  The patient  reports that she has  never smoked. She does not have any smokeless tobacco history on file. She reports that she does not drink alcohol or use illicit drugs. Her sister left her husband and she is living with her. This is providing stressed.  Works at [employer].   Family History  Problem Relation Age of Onset  . Hyperlipidemia Mother     heart issues with 2 angioplasty and high cholesterol      . Diabetes Father     poor circulation  . Hypertension Father   . Heart attack Father   . Colon cancer Maternal Uncle   . Colon cancer Paternal Uncle     x 2 with colon cancer  . Hypertension Sister     ROS:  Please see the history of present illness.   Denies any myalgias, fevers, chills, orthopnea, PND, chest. Positive for insomnia.    All other systems reviewed and negative.   PHYSICAL EXAM: VS:  BP 112/66 mmHg  Pulse  70  Ht 4\' 11"  (1.499 m)  Wt 149 lb (67.586 kg)  BMI 30.08 kg/m2 Well nourished, well developed, in no acute distress HEENT: normal, [place]/AT, EOMI Neck: no JVD, normal carotid upstroke, no bruit Cardiac:  normal S1, S2; RRR; no murmur Lungs:  clear to auscultation bilaterally, no wheezing, rhonchi or rales Abd: soft, nontender, no hepatomegaly, no bruits Ext: no edema, 2+ distal pulses Skin: warm and dry GU: deferred Neuro: no focal abnormalities noted, AAO x 3  EKG:  Today-04/05/15-sinus rhythm, 70, no other abnormalities personally viewed-03/23/14-sinus rhythm, 65, no other abnormalities    ASSESSMENT AND PLAN:  1. Leg cramps-much improved. Potassium, magnesium, hydration. 2. Hyperlipidemia-pravastatin 40. Doing well. Will check lipid panel today. 3. Family history of CAD-as described above. Mother with angioplasty. 4. Insomnia-Ambien, melatonin. Seeing a sleep specialist. 5. I think at this point, she is doing very well especially from palpitation standpoint. EKG today is normal. No PVCs. I'm comfortable seeing her back on as-needed basis. We will check lipid panel.  Signed, Candee Furbish, MD [facility]  04/05/2015 8:49 AM

## 2015-04-05 NOTE — Patient Instructions (Signed)
Medication Instructions:  Your physician recommends that you continue on your current medications as directed. Please refer to the Current Medication list given to you today.   Labwork: Lipid Panel today  Testing/Procedures: None ordered  Follow-Up: Your physician recommends that you schedule a follow-up appointment as needed  Any Other Special Instructions Will Be Listed Below (If Applicable).

## 2015-04-13 ENCOUNTER — Telehealth: Payer: Self-pay | Admitting: Cardiology

## 2015-04-13 NOTE — Telephone Encounter (Signed)
Left message to call back  

## 2015-04-13 NOTE — Telephone Encounter (Signed)
Reviewed results with pt who states understanding. 

## 2015-04-13 NOTE — Telephone Encounter (Signed)
F/u        Pt returning phone call to get lab results.

## 2015-05-05 ENCOUNTER — Other Ambulatory Visit: Payer: Self-pay | Admitting: Cardiology

## 2016-08-29 DIAGNOSIS — M9905 Segmental and somatic dysfunction of pelvic region: Secondary | ICD-10-CM | POA: Diagnosis not present

## 2016-08-29 DIAGNOSIS — M5416 Radiculopathy, lumbar region: Secondary | ICD-10-CM | POA: Diagnosis not present

## 2016-08-29 DIAGNOSIS — M9903 Segmental and somatic dysfunction of lumbar region: Secondary | ICD-10-CM | POA: Diagnosis not present

## 2016-10-01 DIAGNOSIS — M9903 Segmental and somatic dysfunction of lumbar region: Secondary | ICD-10-CM | POA: Diagnosis not present

## 2016-10-01 DIAGNOSIS — M5416 Radiculopathy, lumbar region: Secondary | ICD-10-CM | POA: Diagnosis not present

## 2016-10-01 DIAGNOSIS — M9905 Segmental and somatic dysfunction of pelvic region: Secondary | ICD-10-CM | POA: Diagnosis not present

## 2016-10-29 DIAGNOSIS — M5416 Radiculopathy, lumbar region: Secondary | ICD-10-CM | POA: Diagnosis not present

## 2016-10-29 DIAGNOSIS — M9905 Segmental and somatic dysfunction of pelvic region: Secondary | ICD-10-CM | POA: Diagnosis not present

## 2016-10-29 DIAGNOSIS — M9903 Segmental and somatic dysfunction of lumbar region: Secondary | ICD-10-CM | POA: Diagnosis not present

## 2016-11-26 DIAGNOSIS — M9903 Segmental and somatic dysfunction of lumbar region: Secondary | ICD-10-CM | POA: Diagnosis not present

## 2016-11-26 DIAGNOSIS — M9905 Segmental and somatic dysfunction of pelvic region: Secondary | ICD-10-CM | POA: Diagnosis not present

## 2016-11-26 DIAGNOSIS — M5416 Radiculopathy, lumbar region: Secondary | ICD-10-CM | POA: Diagnosis not present

## 2016-12-24 DIAGNOSIS — M9903 Segmental and somatic dysfunction of lumbar region: Secondary | ICD-10-CM | POA: Diagnosis not present

## 2016-12-24 DIAGNOSIS — M9905 Segmental and somatic dysfunction of pelvic region: Secondary | ICD-10-CM | POA: Diagnosis not present

## 2016-12-24 DIAGNOSIS — M5416 Radiculopathy, lumbar region: Secondary | ICD-10-CM | POA: Diagnosis not present

## 2017-01-21 DIAGNOSIS — M9905 Segmental and somatic dysfunction of pelvic region: Secondary | ICD-10-CM | POA: Diagnosis not present

## 2017-01-21 DIAGNOSIS — M9903 Segmental and somatic dysfunction of lumbar region: Secondary | ICD-10-CM | POA: Diagnosis not present

## 2017-01-21 DIAGNOSIS — M5416 Radiculopathy, lumbar region: Secondary | ICD-10-CM | POA: Diagnosis not present

## 2017-02-18 DIAGNOSIS — M9905 Segmental and somatic dysfunction of pelvic region: Secondary | ICD-10-CM | POA: Diagnosis not present

## 2017-02-18 DIAGNOSIS — M9903 Segmental and somatic dysfunction of lumbar region: Secondary | ICD-10-CM | POA: Diagnosis not present

## 2017-02-18 DIAGNOSIS — M5416 Radiculopathy, lumbar region: Secondary | ICD-10-CM | POA: Diagnosis not present

## 2017-03-18 DIAGNOSIS — M5416 Radiculopathy, lumbar region: Secondary | ICD-10-CM | POA: Diagnosis not present

## 2017-03-18 DIAGNOSIS — M9905 Segmental and somatic dysfunction of pelvic region: Secondary | ICD-10-CM | POA: Diagnosis not present

## 2017-03-18 DIAGNOSIS — M9903 Segmental and somatic dysfunction of lumbar region: Secondary | ICD-10-CM | POA: Diagnosis not present

## 2017-04-15 DIAGNOSIS — M9905 Segmental and somatic dysfunction of pelvic region: Secondary | ICD-10-CM | POA: Diagnosis not present

## 2017-04-15 DIAGNOSIS — E785 Hyperlipidemia, unspecified: Secondary | ICD-10-CM | POA: Diagnosis not present

## 2017-04-15 DIAGNOSIS — M5416 Radiculopathy, lumbar region: Secondary | ICD-10-CM | POA: Diagnosis not present

## 2017-04-15 DIAGNOSIS — G4700 Insomnia, unspecified: Secondary | ICD-10-CM | POA: Diagnosis not present

## 2017-04-15 DIAGNOSIS — D509 Iron deficiency anemia, unspecified: Secondary | ICD-10-CM | POA: Diagnosis not present

## 2017-04-15 DIAGNOSIS — M9903 Segmental and somatic dysfunction of lumbar region: Secondary | ICD-10-CM | POA: Diagnosis not present

## 2017-04-29 DIAGNOSIS — S46892A Other injury of other muscles, fascia and tendons at shoulder and upper arm level, left arm, initial encounter: Secondary | ICD-10-CM | POA: Diagnosis not present

## 2017-05-13 DIAGNOSIS — M5416 Radiculopathy, lumbar region: Secondary | ICD-10-CM | POA: Diagnosis not present

## 2017-05-13 DIAGNOSIS — M9903 Segmental and somatic dysfunction of lumbar region: Secondary | ICD-10-CM | POA: Diagnosis not present

## 2017-05-13 DIAGNOSIS — M9905 Segmental and somatic dysfunction of pelvic region: Secondary | ICD-10-CM | POA: Diagnosis not present

## 2017-05-22 DIAGNOSIS — Z1231 Encounter for screening mammogram for malignant neoplasm of breast: Secondary | ICD-10-CM | POA: Diagnosis not present

## 2017-06-08 DIAGNOSIS — M5416 Radiculopathy, lumbar region: Secondary | ICD-10-CM | POA: Diagnosis not present

## 2017-06-08 DIAGNOSIS — M9903 Segmental and somatic dysfunction of lumbar region: Secondary | ICD-10-CM | POA: Diagnosis not present

## 2017-06-08 DIAGNOSIS — M9905 Segmental and somatic dysfunction of pelvic region: Secondary | ICD-10-CM | POA: Diagnosis not present

## 2017-06-11 DIAGNOSIS — M5416 Radiculopathy, lumbar region: Secondary | ICD-10-CM | POA: Diagnosis not present

## 2017-06-11 DIAGNOSIS — M9905 Segmental and somatic dysfunction of pelvic region: Secondary | ICD-10-CM | POA: Diagnosis not present

## 2017-06-11 DIAGNOSIS — M9903 Segmental and somatic dysfunction of lumbar region: Secondary | ICD-10-CM | POA: Diagnosis not present

## 2017-06-15 DIAGNOSIS — M9903 Segmental and somatic dysfunction of lumbar region: Secondary | ICD-10-CM | POA: Diagnosis not present

## 2017-06-15 DIAGNOSIS — M5416 Radiculopathy, lumbar region: Secondary | ICD-10-CM | POA: Diagnosis not present

## 2017-06-15 DIAGNOSIS — M9905 Segmental and somatic dysfunction of pelvic region: Secondary | ICD-10-CM | POA: Diagnosis not present

## 2017-06-29 DIAGNOSIS — Z01419 Encounter for gynecological examination (general) (routine) without abnormal findings: Secondary | ICD-10-CM | POA: Diagnosis not present

## 2017-07-13 DIAGNOSIS — M9905 Segmental and somatic dysfunction of pelvic region: Secondary | ICD-10-CM | POA: Diagnosis not present

## 2017-07-13 DIAGNOSIS — M5416 Radiculopathy, lumbar region: Secondary | ICD-10-CM | POA: Diagnosis not present

## 2017-07-13 DIAGNOSIS — M9903 Segmental and somatic dysfunction of lumbar region: Secondary | ICD-10-CM | POA: Diagnosis not present

## 2017-08-14 DIAGNOSIS — M5416 Radiculopathy, lumbar region: Secondary | ICD-10-CM | POA: Diagnosis not present

## 2017-08-14 DIAGNOSIS — M9905 Segmental and somatic dysfunction of pelvic region: Secondary | ICD-10-CM | POA: Diagnosis not present

## 2017-08-14 DIAGNOSIS — M9903 Segmental and somatic dysfunction of lumbar region: Secondary | ICD-10-CM | POA: Diagnosis not present

## 2017-09-11 DIAGNOSIS — M5416 Radiculopathy, lumbar region: Secondary | ICD-10-CM | POA: Diagnosis not present

## 2017-09-11 DIAGNOSIS — M9903 Segmental and somatic dysfunction of lumbar region: Secondary | ICD-10-CM | POA: Diagnosis not present

## 2017-09-11 DIAGNOSIS — M9905 Segmental and somatic dysfunction of pelvic region: Secondary | ICD-10-CM | POA: Diagnosis not present

## 2017-09-21 DIAGNOSIS — N951 Menopausal and female climacteric states: Secondary | ICD-10-CM | POA: Diagnosis not present

## 2017-09-21 DIAGNOSIS — R102 Pelvic and perineal pain: Secondary | ICD-10-CM | POA: Diagnosis not present

## 2017-10-28 DIAGNOSIS — R102 Pelvic and perineal pain: Secondary | ICD-10-CM | POA: Diagnosis not present

## 2017-10-28 DIAGNOSIS — M62838 Other muscle spasm: Secondary | ICD-10-CM | POA: Diagnosis not present

## 2017-11-02 DIAGNOSIS — R102 Pelvic and perineal pain: Secondary | ICD-10-CM | POA: Diagnosis not present

## 2017-11-02 DIAGNOSIS — M62838 Other muscle spasm: Secondary | ICD-10-CM | POA: Diagnosis not present

## 2017-11-03 DIAGNOSIS — S058X2A Other injuries of left eye and orbit, initial encounter: Secondary | ICD-10-CM | POA: Diagnosis not present

## 2017-11-06 DIAGNOSIS — M5416 Radiculopathy, lumbar region: Secondary | ICD-10-CM | POA: Diagnosis not present

## 2017-11-06 DIAGNOSIS — M9905 Segmental and somatic dysfunction of pelvic region: Secondary | ICD-10-CM | POA: Diagnosis not present

## 2017-11-06 DIAGNOSIS — M9903 Segmental and somatic dysfunction of lumbar region: Secondary | ICD-10-CM | POA: Diagnosis not present

## 2017-11-11 DIAGNOSIS — R102 Pelvic and perineal pain: Secondary | ICD-10-CM | POA: Diagnosis not present

## 2017-11-11 DIAGNOSIS — M62838 Other muscle spasm: Secondary | ICD-10-CM | POA: Diagnosis not present

## 2017-11-16 DIAGNOSIS — R102 Pelvic and perineal pain: Secondary | ICD-10-CM | POA: Diagnosis not present

## 2017-11-16 DIAGNOSIS — M62838 Other muscle spasm: Secondary | ICD-10-CM | POA: Diagnosis not present

## 2017-11-25 DIAGNOSIS — M62838 Other muscle spasm: Secondary | ICD-10-CM | POA: Diagnosis not present

## 2017-11-25 DIAGNOSIS — R102 Pelvic and perineal pain: Secondary | ICD-10-CM | POA: Diagnosis not present

## 2017-12-14 DIAGNOSIS — R102 Pelvic and perineal pain: Secondary | ICD-10-CM | POA: Diagnosis not present

## 2017-12-14 DIAGNOSIS — M62838 Other muscle spasm: Secondary | ICD-10-CM | POA: Diagnosis not present

## 2017-12-23 DIAGNOSIS — R102 Pelvic and perineal pain: Secondary | ICD-10-CM | POA: Diagnosis not present

## 2017-12-23 DIAGNOSIS — M62838 Other muscle spasm: Secondary | ICD-10-CM | POA: Diagnosis not present

## 2017-12-28 DIAGNOSIS — R102 Pelvic and perineal pain: Secondary | ICD-10-CM | POA: Diagnosis not present

## 2017-12-28 DIAGNOSIS — M62838 Other muscle spasm: Secondary | ICD-10-CM | POA: Diagnosis not present

## 2017-12-29 DIAGNOSIS — E785 Hyperlipidemia, unspecified: Secondary | ICD-10-CM | POA: Diagnosis not present

## 2017-12-29 DIAGNOSIS — G4700 Insomnia, unspecified: Secondary | ICD-10-CM | POA: Diagnosis not present

## 2018-01-06 DIAGNOSIS — R102 Pelvic and perineal pain: Secondary | ICD-10-CM | POA: Diagnosis not present

## 2018-01-06 DIAGNOSIS — M62838 Other muscle spasm: Secondary | ICD-10-CM | POA: Diagnosis not present

## 2018-01-11 DIAGNOSIS — R102 Pelvic and perineal pain: Secondary | ICD-10-CM | POA: Diagnosis not present

## 2018-01-11 DIAGNOSIS — M62838 Other muscle spasm: Secondary | ICD-10-CM | POA: Diagnosis not present

## 2018-01-25 DIAGNOSIS — R102 Pelvic and perineal pain: Secondary | ICD-10-CM | POA: Diagnosis not present

## 2018-01-25 DIAGNOSIS — M62838 Other muscle spasm: Secondary | ICD-10-CM | POA: Diagnosis not present

## 2018-02-08 DIAGNOSIS — R102 Pelvic and perineal pain: Secondary | ICD-10-CM | POA: Diagnosis not present

## 2018-02-08 DIAGNOSIS — M62838 Other muscle spasm: Secondary | ICD-10-CM | POA: Diagnosis not present

## 2018-02-22 DIAGNOSIS — R102 Pelvic and perineal pain: Secondary | ICD-10-CM | POA: Diagnosis not present

## 2018-02-22 DIAGNOSIS — M62838 Other muscle spasm: Secondary | ICD-10-CM | POA: Diagnosis not present

## 2018-03-08 DIAGNOSIS — M62838 Other muscle spasm: Secondary | ICD-10-CM | POA: Diagnosis not present

## 2018-03-08 DIAGNOSIS — R102 Pelvic and perineal pain: Secondary | ICD-10-CM | POA: Diagnosis not present

## 2018-05-26 DIAGNOSIS — Z1231 Encounter for screening mammogram for malignant neoplasm of breast: Secondary | ICD-10-CM | POA: Diagnosis not present

## 2018-06-09 DIAGNOSIS — H524 Presbyopia: Secondary | ICD-10-CM | POA: Diagnosis not present

## 2018-06-29 DIAGNOSIS — M8580 Other specified disorders of bone density and structure, unspecified site: Secondary | ICD-10-CM | POA: Diagnosis not present

## 2018-07-13 DIAGNOSIS — M8589 Other specified disorders of bone density and structure, multiple sites: Secondary | ICD-10-CM | POA: Diagnosis not present

## 2018-07-13 DIAGNOSIS — Z780 Asymptomatic menopausal state: Secondary | ICD-10-CM | POA: Diagnosis not present

## 2018-07-13 DIAGNOSIS — Z90710 Acquired absence of both cervix and uterus: Secondary | ICD-10-CM | POA: Diagnosis not present

## 2019-05-11 ENCOUNTER — Other Ambulatory Visit: Payer: Self-pay

## 2019-05-11 ENCOUNTER — Ambulatory Visit: Payer: 59 | Admitting: Podiatry

## 2019-05-11 ENCOUNTER — Ambulatory Visit (INDEPENDENT_AMBULATORY_CARE_PROVIDER_SITE_OTHER): Payer: 59

## 2019-05-11 ENCOUNTER — Encounter: Payer: Self-pay | Admitting: Podiatry

## 2019-05-11 DIAGNOSIS — M2021 Hallux rigidus, right foot: Secondary | ICD-10-CM | POA: Diagnosis not present

## 2019-05-11 DIAGNOSIS — M2011 Hallux valgus (acquired), right foot: Secondary | ICD-10-CM

## 2019-05-11 DIAGNOSIS — M7751 Other enthesopathy of right foot: Secondary | ICD-10-CM | POA: Diagnosis not present

## 2019-05-11 DIAGNOSIS — M779 Enthesopathy, unspecified: Secondary | ICD-10-CM

## 2019-05-11 DIAGNOSIS — M2012 Hallux valgus (acquired), left foot: Secondary | ICD-10-CM

## 2019-05-11 DIAGNOSIS — M2022 Hallux rigidus, left foot: Secondary | ICD-10-CM

## 2019-05-11 NOTE — Progress Notes (Signed)
Subjective:   Patient ID: Jasmin Pugh, female   DOB: 58 y.o.   MRN: [id number]   HPI Patient states she is had a lot of problems on her big toe joint on the right foot and has a big mass underneath her right arch which is grown over the last year.  States her left bothers her mildly but not to the same degree and patient states that she is tried shoe gear modification and soaks without relief of symptoms.  Patient does not smoke likes to be active   Review of Systems  All other systems reviewed and are negative.       Objective:  Physical Exam Vitals signs and nursing note reviewed.  Constitutional:      Appearance: She is well-developed.  Pulmonary:     Effort: Pulmonary effort is normal.  Musculoskeletal: Normal range of motion.  Skin:    General: Skin is warm.  Neurological:     Mental Status: She is alert.     Neurovascular status found to be intact muscle strength found to be adequate range of motion within normal limits with range of motion loss first MPJ right but no crepitus of the joint with reasonable excursion.  There is a approximate 1.5 x 1.5 cm mass within the plantar fascia of the mid arch area right and mild discomfort on the left with minimal loss of motion noted.  There is mild bunion deformity bilateral and dorsal spurring right first metatarsal over left.  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  I explained the procedure and what would be required and patient wants to have surgery done but may need to wait for several months.  Due to the intense discomfort I did do sterile prep and injected around the first MPJ right 3 mg Dexasone Kenalog 5 mg Xylocaine to try to reduce  the inflammation and I discussed hallux limitus correction right with plantar fibroma excision.  Reappoint to recheck prior to surgery and will schedule with the [facility]  X-rays indicate that there is range of motion loss with limb with reduced joint space first metatarsal right over left

## 2019-05-11 NOTE — Progress Notes (Signed)
   Subjective:    Patient ID: Jasmin Pugh, female    DOB: [birth year], 58 y.o.   MRN: [id number]  HPI    Review of Systems  All other systems reviewed and are negative.      Objective:   Physical Exam        Assessment & Plan:

## 2019-05-11 NOTE — Patient Instructions (Addendum)
Bunion  A bunion is a bump on the base of the big toe that forms when the bones of the big toe joint move out of position. Bunions may be small at first, but they often get larger over time. They can make walking painful. What are the causes? A bunion may be caused by:  Wearing narrow or pointed shoes that force the big toe to press against the other toes.  Abnormal foot development that causes the foot to roll inward (pronate).  Changes in the foot that are caused by certain diseases, such as rheumatoid arthritis or polio.  A foot injury. What increases the risk? The following factors may make you more likely to develop this condition:  Wearing shoes that squeeze the toes together.  Having certain diseases, such as: ? Rheumatoid arthritis. ? Polio. ? Cerebral palsy.  Having family members who have bunions.  Being born with a foot deformity, such as flat feet or low arches.  Doing activities that put a lot of pressure on the feet, such as ballet dancing. What are the signs or symptoms? The main symptom of a bunion is a noticeable bump on the big toe. Other symptoms may include:  Pain.  Swelling around the big toe.  Redness and inflammation.  Thick or hardened skin on the big toe or between the toes.  Stiffness or loss of motion in the big toe.  Trouble with walking. How is this diagnosed? A bunion may be diagnosed based on your symptoms, medical history, and activities. You may have tests, such as:  X-rays. These allow your health care provider to check the position of the bones in your foot and look for damage to your joint. They also help your health care provider determine the severity of your bunion and the best way to treat it.  Joint aspiration. In this test, a sample of fluid is removed from the toe joint. This test may be done if you are in a lot of pain. It helps rule out diseases that cause painful swelling of the joints, such as arthritis. How is this  treated? Treatment depends on the severity of your symptoms. The goal of treatment is to relieve symptoms and prevent the bunion from getting worse. Your health care provider may recommend:  Wearing shoes that have a wide toe box.  Using bunion pads to cushion the affected area.  Taping your toes together to keep them in a normal position.  Placing a device inside your shoe (orthotics) to help reduce pressure on your toe joint.  Taking medicine to ease pain, inflammation, and swelling.  Applying heat or ice to the affected area.  Doing stretching exercises.  Surgery to remove scar tissue and move the toes back into their normal position. This treatment is rare. Follow these instructions at home: Managing pain, stiffness, and swelling   If directed, put ice on the painful area: ? Put ice in a plastic bag. ? Place a towel between your skin and the bag. ? Leave the ice on for 20 minutes, 2-3 times a day. Activity   If directed, apply heat to the affected area before you exercise. Use the heat source that your health care provider recommends, such as a moist heat pack or a heating pad. ? Place a towel between your skin and the heat source. ? Leave the heat on for 20-30 minutes. ? Remove the heat if your skin turns bright red. This is especially important if you are unable to feel pain,   heat, or cold. You may have a greater risk of getting burned.  Do exercises as told by your health care provider. General instructions  Support your toe joint with proper footwear, shoe padding, or taping as told by your health care provider.  Take over-the-counter and prescription medicines only as told by your health care provider.  Keep all follow-up visits as told by your health care provider. This is important. Contact a health care provider if your symptoms:  Get worse.  Do not improve in 2 weeks. Get help right away if you have:  Severe pain and trouble with walking. Summary  A  bunion is a bump on the base of the big toe that forms when the bones of the big toe joint move out of position.  Bunions can make walking painful.  Treatment depends on the severity of your symptoms.  Support your toe joint with proper footwear, shoe padding, or taping as told by your health care provider. This information is not intended to replace advice given to you by your health care provider. Make sure you discuss any questions you have with your health care provider. Document Released: 07/28/2005 Document Revised: 02/01/2018 Document Reviewed: 12/08/2017 Elsevier Patient Education  2020 [employer].  Hallux Rigidus  Hallux rigidus is a type of joint pain or joint disease (arthritis) that affects your big toe (hallux). This condition involves the joint that connects the base of your big toe to the main part of your foot (metatarsophalangeal joint or MTP joint). This condition can cause your big toe to become stiff, painful, and difficult to move. Symptoms may get worse with movement or in cold or damp weather. The condition gets worse over time. What are the causes? This condition may be caused by having a foot that does not function the way that it should or that has an abnormal shape (structural deformity). These foot problems can run in families and may be passed down from parents to children (are hereditary). This condition can also be caused by:  Injury.  Overuse.  Certain inflammatory diseases, including gout and rheumatoid arthritis. What increases the risk? You are more likely to develop this condition if you have:  A foot bone (metatarsal) that is longer or higher than normal.  A family history of hallux rigidus.  Previously injured your big toe.  Feet that do not have a curve (arch) on the inner side of the foot. This may be called flat feet or fallen arches.  Ankles that turn in when you walk (pronation).  Rheumatoid arthritis or gout.  A job that requires  you to stoop down often at work. What are the signs or symptoms? Symptoms of this condition include:  Big toe pain.  Stiffness and difficulty moving the big toe.  Swelling of the toe and surrounding area.  Bone spurs. These are bony growths that can form on the joint of the big toe.  A limp. How is this diagnosed? This condition is diagnosed based on your medical history and a physical exam. You may also have X-rays. How is this treated? This condition is treated by:  Wearing roomy, comfortable shoes that have a large toe box.  Putting orthotic devices in your shoes.  Taking pain medicines.  Having physical therapy.  Icing the injured area.  Alternating between putting your foot in cold water and then in warm water. If your condition is severe, treatment may include:  Corticosteroid injections to relieve pain.  Surgery to remove bone spurs, fuse  damaged bones together, or replace the entire joint. Follow these instructions at home: Managing pain, stiffness, and swelling   Put your feet in cold water for 30 seconds, and then in warm water for 30 seconds. Alternate between the cold and warm water for 5 minutes. Do this several times a day or as told by your health care provider.  If directed, put ice on the injured area. ? Put ice in a plastic bag. ? Place a towel between your skin and the bag. ? Leave the ice on for 20 minutes, 2-3 times a day. General instructions  Take over-the-counter and prescription medicines only as told by your health care provider.  Do not wear high heels or other restrictive footwear. Wear comfortable, supportive shoes that have a large toe box.  Wear shoe inserts (orthotics) as told by your health care provider, if this applies.  Do foot exercises as instructed by your health care provider or a physical therapist.  Keep all follow-up visits as told by your health care provider. This is important. Contact a health care provider if:   You notice bone spurs or growths on or around your big toe.  Your pain does not get better or it gets worse.  You have pain while resting.  You have pain in other parts of your body, such as your back, hip, or knee.  You start to limp. Summary  Hallux rigidus is a condition that makes your big toe become stiff, painful, and difficult to move.  It can be caused by injury, overuse, or inflammatory diseases.  This condition may be treated with ice, medicines, physical therapy, and surgery.  Do not wear high heels or other restrictive footwear. Wear comfortable, supportive shoes that have a large toe box. This information is not intended to replace advice given to you by your health care provider. Make sure you discuss any questions you have with your health care provider. Document Released: 07/28/2005 Document Revised: 05/07/2018 Document Reviewed: 05/10/2018 Elsevier Patient Education  2020 [employer].

## 2019-07-04 ENCOUNTER — Other Ambulatory Visit: Payer: Self-pay

## 2019-07-04 ENCOUNTER — Ambulatory Visit: Payer: Self-pay

## 2019-07-04 ENCOUNTER — Encounter: Payer: Self-pay | Admitting: Orthopaedic Surgery

## 2019-07-04 ENCOUNTER — Ambulatory Visit (INDEPENDENT_AMBULATORY_CARE_PROVIDER_SITE_OTHER): Payer: 59 | Admitting: Physician Assistant

## 2019-07-04 DIAGNOSIS — M25512 Pain in left shoulder: Secondary | ICD-10-CM | POA: Diagnosis not present

## 2019-07-04 MED ORDER — LIDOCAINE HCL 1 % IJ SOLN
3.0000 mL | INTRAMUSCULAR | Status: AC | PRN
  Administered 2019-07-04: 17:00:00 3 mL

## 2019-07-04 MED ORDER — METHYLPREDNISOLONE ACETATE 40 MG/ML IJ SUSP
40.0000 mg | INTRAMUSCULAR | Status: AC | PRN
  Administered 2019-07-04: 17:00:00 40 mg via INTRA_ARTICULAR

## 2019-07-04 NOTE — Progress Notes (Signed)
Office Visit Note   Patient: Jasmin Pugh           Date of Birth: Feb 04, 1961           MRN: [id number] Visit Date: 07/04/2019              Requested by: Carol Ada, [place],  [place] [address] PCP: Carol Ada, MD   Assessment & Plan: Visit Diagnoses:  1. Left shoulder pain, unspecified chronicity     Plan: Patient shown wall crawls, Codman, pendulum, and forward flexion exercises.  Handouts were given.  We will see her back in 2 weeks to see what type of response she had to the exercises of injection.  Questions were encouraged and answered.  Follow-Up Instructions: Return in about 2 weeks (around 07/18/2019).   Orders:  Orders Placed This Encounter  Procedures  . Large Joint Inj  . XR Shoulder Left   No orders of the defined types were placed in this encounter.     Procedures: Large Joint Inj: L subacromial bursa on 07/04/2019 4:37 PM Indications: pain Details: 22 G 1.5 in needle, superior approach  Arthrogram: No  Medications: 3 mL lidocaine 1 %; 40 mg methylPREDNISolone acetate 40 MG/ML Outcome: tolerated well, no immediate complications Procedure, treatment alternatives, risks and benefits explained, specific risks discussed. Consent was given by the patient. Immediately prior to procedure a time out was called to verify the correct patient, procedure, equipment, support staff and site/side marked as required. Patient was prepped and draped in the usual sterile fashion.       Clinical Data: No additional findings.   Subjective: Chief Complaint  Patient presents with  . Left Shoulder - Pain    HPI Mrs. Eide is a pleasant 58 year old female comes in today with left shoulder pain.  States she had some pain after an injury to the shoulder April 2019.  She reports that she is unsure of what happened to the shoulder but felt like she may have injured it in her sleep.  Told that she had strained her rotator cuff.  In it  well only to have the shoulder pain returned on 05/26/2019.  Last 2 weeks pain has been increasing.  Pain is worse with certain movements particularly overhead and abduction exercises.  States the arm is sometimes sore and tender to touch around the shoulder.  She has been taking meloxicam which is helped some of the shoulder.  Denies any neck pain.  Intermittent sensation in the forearm. Review of Systems Negative for fevers chills shortness of breath chest pain  Objective: Vital Signs: There were no vitals taken for this visit.  Physical Exam Constitutional:      Appearance: She is not ill-appearing or diaphoretic.  Pulmonary:     Effort: Pulmonary effort is normal.  Neurological:     Mental Status: She is alert and oriented to person, place, and time.  Psychiatric:        Mood and Affect: Mood normal.        Behavior: Behavior normal.     Ortho Exam Bilateral shoulders full range of motion.  5 5 strength with external and internal rotation against resistance.  Empty can test is negative.  Impingement testing is negative.  Positive liftoff test on the left negative on the right.  Abduction causes some discomfort in left shoulder. Specialty Comments:  No specialty comments available.  Imaging: Xr Shoulder Left  Result Date: 07/04/2019 Left shoulder 3 views:  No acute fracture.  Shoulder is well located.  Mild AC joint arthritic changes.  Glenohumeral joint is well-maintained.    PMFS History: Patient Active Problem List   Diagnosis Date Noted  . HLD (hyperlipidemia) 04/05/2015  . Hyperlipidemia 04/05/2015  . PVC (premature ventricular contraction) 04/05/2015  . Palpitation 04/05/2015  . Insomnia 04/05/2015  . Leg cramp 04/05/2015  . Abdominal pain 07/07/2011  . Chronic interstitial cystitis 07/07/2011  . Pelvic and perineal pain 07/07/2011   Past Medical History:  Diagnosis Date  . Hyperlipidemia   . Insomnia   . Microscopic hematuria    complete work up neg. for  malignancy 06/2011  . Obstructive sleep apnea    mild   . Vitamin D deficiency disease     Family History  Problem Relation Age of Onset  . Hyperlipidemia Mother        heart issues with 2 angioplasty and high cholesterol      . Diabetes Father        poor circulation  . Hypertension Father   . Heart attack Father   . Hypertension Sister   . Colon cancer Maternal Uncle   . Colon cancer Paternal Uncle        x 2 with colon cancer    Past Surgical History:  Procedure Laterality Date  . CYST REMOVAL HAND     02/2000  . PARTIAL HYSTERECTOMY     Social History   Occupational History  . Not on file  Tobacco Use  . Smoking status: Never Smoker  Substance and Sexual Activity  . Alcohol use: No  . Drug use: No  . Sexual activity: Not on file

## 2019-07-20 ENCOUNTER — Other Ambulatory Visit: Payer: Self-pay

## 2019-07-20 ENCOUNTER — Encounter: Payer: Self-pay | Admitting: Orthopaedic Surgery

## 2019-07-20 ENCOUNTER — Ambulatory Visit (INDEPENDENT_AMBULATORY_CARE_PROVIDER_SITE_OTHER): Payer: 59 | Admitting: Orthopaedic Surgery

## 2019-07-20 DIAGNOSIS — M25512 Pain in left shoulder: Secondary | ICD-10-CM | POA: Diagnosis not present

## 2019-07-20 NOTE — Progress Notes (Signed)
The patient comes in 2 weeks after she was seen for left shoulder.  A subacromial steroid injections provided in the left shoulder and she said that helped quite a bit.  She still has some pain in her shoulder when she sleeps on that shoulder wrong.  She says is hard not to sleep on it at times.  She does have a job that involves repetitive work.  On examination of her left shoulder it is moving much more smoothly than it did 2 weeks ago.  Her range of motion is full.  Her liftoff is negative.  She does have some slight weakness in the rotator cuff.  Certainly try some resistance exercises for the shoulder with using a Thera-Band.  I will have her try Voltaren gel as well as an over-the-counter oral anti-inflammatory.  I will give this about 6 weeks before seeing her back.  At that visit we can always try 1 more steroid injection if needed or even consider a MRI of that shoulder if there is still weakness.  All question concerns were answered and addressed.

## 2019-08-31 ENCOUNTER — Ambulatory Visit: Payer: 59 | Admitting: Orthopaedic Surgery

## 2019-09-02 ENCOUNTER — Telehealth: Payer: Self-pay | Admitting: Podiatry

## 2019-09-02 NOTE — Telephone Encounter (Signed)
Left voicemail asking pt if she had signed the sx consent forms when she saw Dr. Paulla Dolly on 05/11/19. Told her if not, she would need to make a sx consult appointment to come in and see Dr. Paulla Dolly to discuss the sx, have any questions she may have answered, and sign the consent forms and get a sx date. Asked pt to call me back directly.

## 2019-09-02 NOTE — Telephone Encounter (Signed)
Pt called back and scheduled a sx consult appointment with Dr. Paulla Dolly for Monday 01/25 at 1:45 pm.

## 2019-09-02 NOTE — Telephone Encounter (Signed)
I saw Dr. Paulla Dolly on 05/11/2019 about doing sx on my foot. He said to give you a call about talking about scheduling sx. Thank you.

## 2019-09-05 ENCOUNTER — Encounter: Payer: Self-pay | Admitting: Podiatry

## 2019-09-05 ENCOUNTER — Ambulatory Visit (INDEPENDENT_AMBULATORY_CARE_PROVIDER_SITE_OTHER): Payer: 59 | Admitting: Podiatry

## 2019-09-05 ENCOUNTER — Other Ambulatory Visit: Payer: Self-pay

## 2019-09-05 VITALS — Temp 87.2°F

## 2019-09-05 DIAGNOSIS — M722 Plantar fascial fibromatosis: Secondary | ICD-10-CM | POA: Diagnosis not present

## 2019-09-05 DIAGNOSIS — M2021 Hallux rigidus, right foot: Secondary | ICD-10-CM

## 2019-09-05 DIAGNOSIS — M2022 Hallux rigidus, left foot: Secondary | ICD-10-CM

## 2019-09-05 NOTE — Patient Instructions (Signed)
Pre-Operative Instructions  Congratulations, you have decided to take an important step towards improving your quality of life.  You can be assured that the doctors and staff at [facility] will be with you every step of the way.  Here are some important things you should know:  1. Plan to be at the surgery center/hospital at least 1 (one) hour prior to your scheduled time, unless otherwise directed by the surgical center/hospital staff.  You must have a responsible adult accompany you, remain during the surgery and drive you home.  Make sure you have directions to the surgical center/hospital to ensure you arrive on time. 2. If you are having surgery at [facility], you will need a copy of your medical history and physical form from your family physician within one month prior to the date of surgery. We will give you a form for your primary physician to complete.  3. We make every effort to accommodate the date you request for surgery.  However, there are times where surgery dates or times have to be moved.  We will contact you as soon as possible if a change in schedule is required.   4. No aspirin/ibuprofen for one week before surgery.  If you are on aspirin, any non-steroidal anti-inflammatory medications (Mobic, Aleve, Ibuprofen) should not be taken seven (7) days prior to your surgery.  You make take Tylenol for pain prior to surgery.  5. Medications - If you are taking daily heart and blood pressure medications, seizure, reflux, allergy, asthma, anxiety, pain or diabetes medications, make sure you notify the surgery center/hospital before the day of surgery so they can tell you which medications you should take or avoid the day of surgery. 6. No food or drink after midnight the night before surgery unless directed otherwise by surgical center/hospital staff. 7. No alcoholic beverages 24-hours prior to surgery.  No smoking 24-hours prior or 24-hours after  surgery. 8. Wear loose pants or shorts. They should be loose enough to fit over bandages, boots, and casts. 9. Don't wear slip-on shoes. Sneakers are preferred. 10. Bring your boot with you to the surgery center/hospital.  Also bring crutches or a walker if your physician has prescribed it for you.  If you do not have this equipment, it will be provided for you after surgery. 11. If you have not been contacted by the surgery center/hospital by the day before your surgery, call to confirm the date and time of your surgery. 12. Leave-time from work may vary depending on the type of surgery you have.  Appropriate arrangements should be made prior to surgery with your employer. 13. Prescriptions will be provided immediately following surgery by your doctor.  Fill these as soon as possible after surgery and take the medication as directed. Pain medications will not be refilled on weekends and must be approved by the doctor. 14. Remove nail polish on the operative foot and avoid getting pedicures prior to surgery. 15. Wash the night before surgery.  The night before surgery wash the foot and leg well with water and the antibacterial soap provided. Be sure to pay special attention to beneath the toenails and in between the toes.  Wash for at least three (3) minutes. Rinse thoroughly with water and dry well with a towel.  Perform this wash unless told not to do so by your physician.  Enclosed: 1 Ice pack (please put in freezer the night before surgery)   1 Hibiclens skin cleaner     Pre-op instructions  If you have any questions regarding the instructions, please do not hesitate to call our office.  [place]: [address], [place], [place] [address] -- [phone]  [place]: [address] [place], [place] [address] -- [phone]  [place]: [address], [place], [place] [address] -- [phone]   Website: [email or web address] 

## 2019-09-08 NOTE — Progress Notes (Signed)
Subjective:   Patient ID: Jasmin Pugh, female   DOB: 59 y.o.   MRN: [id number]   HPI Patient presents stating her big toe joint has become more bothersome over the last few months and responded well to the medication for just a short period of time and the lesion on the bottom is painful with walking and she wants it removed.  Patient presents consultation   ROS      Objective:  Physical Exam  Vascular status intact with patient having large plantar fibroma most likely plantar right arch measuring about 2 x 2 centimeter and a reduced range of motion first MPJ right with dorsal spurring with pain upon dorsal palpation     Assessment:  Hallux limitus rigidus condition right along with plantar fibromatosis right most likely     Plan:  H&P reviewed x-ray and discussed correction of deformity allowing patient to read consent form going over alternative treatments and complications.  Patient is willing to undergo surgery understanding risk and at this point after extensive review signed consent form understanding the risks as outlined.  Patient scheduled for biplanar osteotomy first metatarsal along with plantar fibroma excision right understanding that that can recur fairly easily and we will be sending it to pathology.  Patient scheduled for surgery and had air fracture walker dispensed today with all instructions on usage and I want her to get used to it prior to procedure

## 2019-09-20 ENCOUNTER — Telehealth: Payer: Self-pay | Admitting: Podiatry

## 2019-09-20 NOTE — Telephone Encounter (Addendum)
DOS: 10/11/2019  SURGICAL PROCEDURES: [facility] [phone]) and Plantar Fibroma [phone]).  [employer] Effective: 08/12/2019 - 08/10/2020  Deductible: $1,500 with $360.13 met and $1,139.87 remains. Out of Pocket: $3,500 with $360.13 met and $3,139.87 remains. CoInsurance is 80% / 20%.  CASE DETAILS NOTIFICATION/PRIOR AUTHORIZATION NUMBER CASE STATUS CASE STATUS REASON PRIMARY CARE PHYSICIAN [id number] Closed Case Was Managed And Is Now Complete - ADVANCE NOTIFY DATE/TIME ADMISSION NOTIFY DATE/TIME 09/22/2019 10:28 AM CST - COVERAGE STATUS OVERALL COVERAGE STATUS Covered/Approved 1-2 CODE DESCRIPTION COVERAGE STATUS DECISION DATE [facility] Coverage determination is reflected for the facility admission and is not a guarantee of payment for ongoing services. Covered/Approved 09/22/2019 1 [id number] Fasciectomy, plantar fascia; radical (se more Covered/Approved 09/22/2019 2 [id number] Correction, hallux valgus (bunionectomy) more Covered/Approved 09/22/2019

## 2019-09-22 ENCOUNTER — Telehealth: Payer: Self-pay | Admitting: Podiatry

## 2019-09-22 NOTE — Telephone Encounter (Signed)
I'm scheduled for surgery on Tuesday 03/02 with Dr. Paulla Dolly. I need to know if I will have to have a COVID test. If so, I may have to adjust my work schedule as I'm scheduled to through the day before surgery. If you could, please call me back and you can leave a message if I don't answer.

## 2019-09-22 NOTE — Telephone Encounter (Signed)
Called pt to let her know that the sx center is not doing COVID testing unless you are being put to sleep for sx. I told her she could call and confirm with them. Pt requested their number which I provided so she could call and confirm with them. Told pt to call with any other questions she may have.

## 2019-10-11 ENCOUNTER — Encounter: Payer: Self-pay | Admitting: Podiatry

## 2019-10-11 ENCOUNTER — Telehealth: Payer: Self-pay | Admitting: *Deleted

## 2019-10-11 ENCOUNTER — Other Ambulatory Visit: Payer: Self-pay | Admitting: Sports Medicine

## 2019-10-11 DIAGNOSIS — D492 Neoplasm of unspecified behavior of bone, soft tissue, and skin: Secondary | ICD-10-CM

## 2019-10-11 DIAGNOSIS — M2011 Hallux valgus (acquired), right foot: Secondary | ICD-10-CM | POA: Diagnosis not present

## 2019-10-11 MED ORDER — ONDANSETRON HCL 4 MG PO TABS: 4.0000 mg | ORAL_TABLET | Freq: Three times a day (TID) | ORAL | 0 refills | Status: DC | PRN

## 2019-10-11 MED ORDER — OXYCODONE-ACETAMINOPHEN 10-325 MG PO TABS: 1.0000 | ORAL_TABLET | Freq: Four times a day (QID) | ORAL | 0 refills | Status: AC | PRN

## 2019-10-11 NOTE — Progress Notes (Signed)
Percocet and zofran sent for post surgical pain

## 2019-10-11 NOTE — Telephone Encounter (Signed)
I spoke with [facility] - An[facility] and she states the paper rx has multiple errors and will need to be escribed. Anique states she can not read and I asked that she fax the zofran and percocet to [phone].

## 2019-10-11 NOTE — Telephone Encounter (Signed)
[facility] - [facility] states Dr. Paulla Dolly hand wrote paper rx with mistakes and she needs clarification.

## 2019-10-11 NOTE — Telephone Encounter (Signed)
Dr. Cannon Kettle sent the medications.

## 2019-10-11 NOTE — Telephone Encounter (Signed)
Received faxed copy of Dr. Mellody Drown rxs from [facility]. I gave to Dr. Jacqualyn Posey to order.

## 2019-10-12 ENCOUNTER — Telehealth: Payer: Self-pay | Admitting: *Deleted

## 2019-10-12 NOTE — Telephone Encounter (Signed)
Called and spoke with the patient and the patient stated that she was doing pretty good and there was not any fever or chills and not any nausea and there was some pain but took a pain pill and it took the edge off and was icing and elevating and the boot seems to be fitting quite well and I stated to call the [place] office if any concerns or questions. Lattie Haw

## 2019-10-14 NOTE — Telephone Encounter (Signed)
I spoke with pt and asked when she was taking the zofran and she stated every 8 hours before the pain medication. I told pt to take the zofran about 30 minutes prior to the percocet and take the percocet with a light non-greasy snack or I could call in another nausea medication. Pt states she has been taking aleve and that is working fine. I told pt that the lack of activity and the narcotic pain medication had slowed her digestive system and she should increase fluids and juices, OTC stool softener like colace as the package instructs. Pt asked if she could sleep without the boot and I told her we would prefer she slept in the boot, but it was important to get good rest, so if she was not able to sleep she may remove the boot but must be in the boot anytime she is walking.

## 2019-10-14 NOTE — Telephone Encounter (Signed)
Pt called and stated that her pains meds are making her nausea. She also would like to know if she could start sleeping without the boot.

## 2019-10-19 ENCOUNTER — Ambulatory Visit (INDEPENDENT_AMBULATORY_CARE_PROVIDER_SITE_OTHER): Payer: 59

## 2019-10-19 ENCOUNTER — Other Ambulatory Visit: Payer: Self-pay

## 2019-10-19 ENCOUNTER — Ambulatory Visit (INDEPENDENT_AMBULATORY_CARE_PROVIDER_SITE_OTHER): Payer: 59 | Admitting: Podiatry

## 2019-10-19 ENCOUNTER — Encounter: Payer: Self-pay | Admitting: Podiatry

## 2019-10-19 DIAGNOSIS — M722 Plantar fascial fibromatosis: Secondary | ICD-10-CM | POA: Diagnosis not present

## 2019-10-19 DIAGNOSIS — M2021 Hallux rigidus, right foot: Secondary | ICD-10-CM

## 2019-10-19 NOTE — Progress Notes (Signed)
Subjective:   Patient ID: Jasmin Pugh, female   DOB: 59 y.o.   MRN: [id number]   HPI Patient presents stating of doing really well with minimal discomfort and only pain if I have my foot on the ground for extended period of time   ROS      Objective:  Physical Exam  Neurovascular status intact negative [name]' sign noted with incision site right first metatarsal right plantar foot doing well wound edges well coapted good range of motion of the hallux with no current crepitus     Assessment:  Doing well post osteotomy right first metatarsal and plantar fibroma resection     Plan:  H&P reviewed condition reapplied sterile dressing continued elevation compression immobilization and reappoint 2 weeks suture removal and we will then schedule for physical therapy  X-rays indicate that the osteotomy is healing well fixation in place good alignment of the joint

## 2019-10-28 ENCOUNTER — Encounter: Payer: Self-pay | Admitting: Podiatry

## 2019-11-10 ENCOUNTER — Ambulatory Visit (INDEPENDENT_AMBULATORY_CARE_PROVIDER_SITE_OTHER): Payer: 59 | Admitting: Podiatry

## 2019-11-10 ENCOUNTER — Other Ambulatory Visit: Payer: Self-pay

## 2019-11-10 ENCOUNTER — Ambulatory Visit (INDEPENDENT_AMBULATORY_CARE_PROVIDER_SITE_OTHER): Payer: 59

## 2019-11-10 VITALS — Temp 97.1°F

## 2019-11-10 DIAGNOSIS — M2011 Hallux valgus (acquired), right foot: Secondary | ICD-10-CM

## 2019-11-10 DIAGNOSIS — M2012 Hallux valgus (acquired), left foot: Secondary | ICD-10-CM

## 2019-11-10 NOTE — Progress Notes (Signed)
Subjective:   Patient ID: Jasmin Pugh, female   DOB: 59 y.o.   MRN: [id number]   HPI Patient presents stating that my motion is a little bit restricted but overall doing well with surgery   ROS      Objective:  Physical Exam  Neurovascular status intact with patient found to have mild range of motion loss first MPJ right foot overall doing well with wound edges well coapted and plantar incision healed well with stitches intact     Assessment:  Doing well post osteotomy right first metatarsal plantar fibroma excision right     Plan:  H&P reviewed both conditions and I went ahead today and recommended physical therapy to try to increase range of motion and continue anti-inflammatories immobilization compression elevation  X-rays indicate osteotomies healing well fixation in place joint healing well bone healing very well

## 2019-11-21 ENCOUNTER — Telehealth: Payer: Self-pay | Admitting: *Deleted

## 2019-11-23 NOTE — Telephone Encounter (Signed)
"  I'm Dr. Mellody Drown patient.  I'm just trying to find out when he wants me to come back for another appointment to see where I'm at with my return to work."

## 2019-11-23 NOTE — Telephone Encounter (Signed)
I informed pt of her appt with Dr. Paulla Dolly on 12/07/2019 at 9:45am, and pt states someone called her yesterday and discussed it.

## 2019-12-07 ENCOUNTER — Encounter: Payer: Self-pay | Admitting: Podiatry

## 2019-12-07 ENCOUNTER — Ambulatory Visit (INDEPENDENT_AMBULATORY_CARE_PROVIDER_SITE_OTHER): Payer: 59 | Admitting: Podiatry

## 2019-12-07 ENCOUNTER — Other Ambulatory Visit: Payer: Self-pay

## 2019-12-07 VITALS — Temp 96.4°F

## 2019-12-07 DIAGNOSIS — M2011 Hallux valgus (acquired), right foot: Secondary | ICD-10-CM

## 2019-12-07 DIAGNOSIS — M722 Plantar fascial fibromatosis: Secondary | ICD-10-CM

## 2019-12-07 DIAGNOSIS — M2012 Hallux valgus (acquired), left foot: Secondary | ICD-10-CM

## 2019-12-07 NOTE — Progress Notes (Signed)
Subjective:   Patient ID: Jasmin Pugh, female   DOB: 59 y.o.   MRN: [id number]   HPI Patient states that she is working on the flexibility the big toe joint but states it still mildly stiff   ROS      Objective:  Physical Exam  Neuro vascular status intact negative [name]' sign noted right first MPJ showing slight stiffness but overall doing okay with no crepitus noted      Assessment:  Continuation of hallux limitus mild stiffness after having had surgery     Plan:  HEP resumed and reviewed condition and recommended the continuation of physical therapy gradual increase in activity levels and trying to bend the toe as much as possible.  Patient will be seen back to recheck  X-rays indicate that the osteotomy is healed

## 2020-02-06 ENCOUNTER — Ambulatory Visit (INDEPENDENT_AMBULATORY_CARE_PROVIDER_SITE_OTHER): Payer: 59 | Admitting: Podiatry

## 2020-02-06 ENCOUNTER — Ambulatory Visit (INDEPENDENT_AMBULATORY_CARE_PROVIDER_SITE_OTHER): Payer: 59

## 2020-02-06 ENCOUNTER — Encounter: Payer: Self-pay | Admitting: Podiatry

## 2020-02-06 ENCOUNTER — Other Ambulatory Visit: Payer: Self-pay

## 2020-02-06 DIAGNOSIS — M2021 Hallux rigidus, right foot: Secondary | ICD-10-CM

## 2020-02-08 NOTE — Progress Notes (Signed)
Subjective:   Patient ID: Jasmin Pugh, female   DOB: 59 y.o.   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 Patient at this point can try to increase activity as best as possible but again understands that it will be a process getting better  X-rays indicate that the osteotomy is healing well with fixation in place

## 2020-04-02 ENCOUNTER — Encounter: Payer: Self-pay | Admitting: Podiatry

## 2020-04-02 ENCOUNTER — Ambulatory Visit (INDEPENDENT_AMBULATORY_CARE_PROVIDER_SITE_OTHER): Payer: 59

## 2020-04-02 ENCOUNTER — Ambulatory Visit (INDEPENDENT_AMBULATORY_CARE_PROVIDER_SITE_OTHER): Payer: 59 | Admitting: Podiatry

## 2020-04-02 ENCOUNTER — Other Ambulatory Visit: Payer: Self-pay

## 2020-04-02 VITALS — Temp 96.7°F

## 2020-04-02 DIAGNOSIS — M2021 Hallux rigidus, right foot: Secondary | ICD-10-CM | POA: Diagnosis not present

## 2020-04-02 DIAGNOSIS — M2022 Hallux rigidus, left foot: Secondary | ICD-10-CM | POA: Diagnosis not present

## 2020-04-02 NOTE — Progress Notes (Signed)
Subjective:   Patient ID: Jasmin Pugh, female   DOB: 59 y.o.   MRN: [id number]   HPI Patient presents stating she is just noticed some tenderness on top of the incision wanted extremity was okay.  States overall she is doing pretty well   ROS      Objective:  Physical Exam  Neurovascular status intact with patient's right dorsal foot showing slight irritation with slight range of motion loss but no pain currently within the joint itself     Assessment:  Appears to be low-grade inflammation with patient does have some arthritis in the joint surface but overall motion at this time is not painful     Plan:  H&P x-ray done reviewed the continuation of anti-inflammatories physical therapy and range of motion exercises.  If symptoms were to become more prevalent in the joint we will consider injection but I think that is premature currently and I educated her on the  X-ray indicates there is some slight changes of the distal metatarsal head but it is structural and it is not changing from previous visit

## 2022-02-17 ENCOUNTER — Encounter: Payer: Self-pay | Admitting: Podiatry

## 2022-02-17 ENCOUNTER — Ambulatory Visit (INDEPENDENT_AMBULATORY_CARE_PROVIDER_SITE_OTHER): Payer: 59 | Admitting: Podiatry

## 2022-02-17 DIAGNOSIS — M2022 Hallux rigidus, left foot: Secondary | ICD-10-CM | POA: Diagnosis not present

## 2022-02-17 DIAGNOSIS — M2021 Hallux rigidus, right foot: Secondary | ICD-10-CM

## 2022-02-17 DIAGNOSIS — B49 Unspecified mycosis: Secondary | ICD-10-CM | POA: Diagnosis not present

## 2022-02-17 MED ORDER — TERBINAFINE HCL 250 MG PO TABS: 250.0000 mg | ORAL_TABLET | Freq: Every day | ORAL | 0 refills | Status: AC

## 2022-02-17 MED ORDER — CLOTRIMAZOLE-BETAMETHASONE 1-0.05 % EX CREA: 1.0000 | TOPICAL_CREAM | Freq: Two times a day (BID) | CUTANEOUS | 0 refills | Status: AC

## 2022-02-17 NOTE — Progress Notes (Signed)
Subjective:   Patient ID: Jasmin Pugh, female   DOB: 61 y.o.   MRN: [id number]   HPI Patient presents stating she developed a lot of problems with the outside plantar of the right foot with itching and has been on topical medicines which helped temporarily but are not solving the problem and has been on antifungals topical and also states her big toe joint is doing well from surgery   ROS      Objective:  Physical Exam  Neurovascular status found to be intact range of motion found to be adequate with patient found to have breakdown of tissue plantar lateral aspect of the right midfoot with good range of motion no crepitus of the joint noted upon palpation     Assessment:  Probability for some form of fungal infection with dermatitis-like condition plantar lateral right along with well-healed surgical site from previous hallux limitus surgery     Plan:  H&P all conditions reviewed and we will get started on oral antifungal for 45 days Lamisil explaining risk and topical Lotrisone cream.  If not improvement will consider dermatologist or I will see her back and I am satisfied now with the correction of the big toe joint right

## 2022-04-19 LAB — GLUCOSE, POCT (MANUAL RESULT ENTRY): POC Glucose: 92 mg/dl (ref 70–99)

## 2022-09-25 ENCOUNTER — Ambulatory Visit (INDEPENDENT_AMBULATORY_CARE_PROVIDER_SITE_OTHER): Payer: 59 | Admitting: Podiatry

## 2022-09-25 ENCOUNTER — Encounter: Payer: Self-pay | Admitting: Podiatry

## 2022-09-25 DIAGNOSIS — M7752 Other enthesopathy of left foot: Secondary | ICD-10-CM | POA: Diagnosis not present

## 2022-09-25 DIAGNOSIS — M205X2 Other deformities of toe(s) (acquired), left foot: Secondary | ICD-10-CM

## 2022-09-25 DIAGNOSIS — M79675 Pain in left toe(s): Secondary | ICD-10-CM

## 2022-09-25 MED ORDER — TRIAMCINOLONE ACETONIDE 10 MG/ML IJ SUSP
10.0000 mg | Freq: Once | INTRAMUSCULAR | Status: AC
  Administered 2022-09-25: 10 mg

## 2022-09-25 NOTE — Progress Notes (Signed)
Subjective:   Patient ID: Jasmin Pugh, female   DOB: 62 y.o.   MRN: [id number]   HPI Patient states she is got a lot of pain in the second toe and second joint knows she has arthritis of the big toe joint and the right foot is doing well after surgery a number of years ago   ROS      Objective:  Physical Exam  Neurovascular status intact with patient found to have inflammation pain of the second MPJ left narrowed joint surface first MPJ with reduced range of motion and discomfort on the lateral side of the first MPJ joint and into the dorsal surface     Assessment:  Possibility for inflammatory capsulitis second MPJ left with also hallux limitus with inflammation of the first MPJ left which could be contributory     Plan:  H&P reviewed did a proximal nerve block of the area aspirated the second MPJ getting out a small amount of clear fluid injected quarter cc dexamethasone Kenalog put a small amount of medicine also into the lateral first MPJ joint surface advised on rigid bottom shoes reappoint to recheck 4 weeks  X-rays indicate there is spurring around the first metatarsal head left moderate narrowing of the joint surface with multiple signs of moderate structural hallux limitus rigidus deformity

## 2022-10-22 ENCOUNTER — Ambulatory Visit
Admission: EM | Admit: 2022-10-22 | Discharge: 2022-10-22 | Disposition: A | Discharge status: Home / Self Care | Payer: 59 | Attending: Nurse Practitioner | Admitting: Nurse Practitioner

## 2022-10-22 DIAGNOSIS — K529 Noninfective gastroenteritis and colitis, unspecified: Secondary | ICD-10-CM

## 2022-10-22 DIAGNOSIS — R112 Nausea with vomiting, unspecified: Secondary | ICD-10-CM

## 2022-10-22 MED ORDER — ONDANSETRON 4 MG PO TBDP: 4.0000 mg | ORAL_TABLET | Freq: Three times a day (TID) | ORAL | 0 refills | Status: DC | PRN

## 2022-10-22 MED ORDER — ONDANSETRON 4 MG PO TBDP
4.0000 mg | ORAL_TABLET | Freq: Once | ORAL | Status: AC
  Administered 2022-10-22: 4 mg via ORAL

## 2022-10-22 NOTE — ED Provider Notes (Signed)
[facility]    CSN: [id number] Arrival date & time: 10/22/22  1427      History   Chief Complaint Chief Complaint  Patient presents with   Nausea   Vomiting    HPI Jasmin Pugh is a 62 y.o. female presents for evaluation of nausea and vomiting.  Patient reports last night she awoke with vomiting and has had several nonbloody episodes of vomit since then.  She has been going to the bathroom more but states it is not diarrhea.  Denies any fevers, URI symptoms, abdominal pain, diarrhea.  Denies any recent travel.  No history of IBS or Crohn's.  Denies ingestion of contaminated or undercooked foods.  She is try to get water down but is unable to due to the vomiting.  No known sick contacts.  No other concerns at this time.  HPI  Past Medical History:  Diagnosis Date   Hyperlipidemia    Insomnia    Microscopic hematuria    complete work up neg. for malignancy 06/2011   Obstructive sleep apnea    mild    Vitamin D deficiency disease     Patient Active Problem List   Diagnosis Date Noted   HLD (hyperlipidemia) 04/05/2015   Hyperlipidemia 04/05/2015   PVC (premature ventricular contraction) 04/05/2015   Palpitation 04/05/2015   Insomnia 04/05/2015   Leg cramp 04/05/2015   Abdominal pain 07/07/2011   Chronic interstitial cystitis 07/07/2011   Pelvic and perineal pain 07/07/2011    Past Surgical History:  Procedure Laterality Date   CYST REMOVAL HAND     02/2000   PARTIAL HYSTERECTOMY      OB History   No obstetric history on file.      Home Medications    Prior to Admission medications   Medication Sig Start Date End Date Taking? Authorizing Provider  ondansetron (ZOFRAN-ODT) 4 MG disintegrating tablet Take 1 tablet (4 mg total) by mouth every 8 (eight) hours as needed for nausea or vomiting. 10/22/22  Yes Melynda Ripple, NP  calcium-vitamin D 250-100 MG-UNIT per tablet Take 1200 mg as directed    [provider]  clotrimazole-betamethasone  (LOTRISONE) cream Apply 1 Application topically 2 (two) times daily. 02/17/22   Wallene Huh, DPM  Coenzyme Q10 (COQ10 PO) Take 1 tablet by mouth daily.    [provider]  Elderberry 500 MG CAPS     [provider]  ferrous sulfate 325 (65 FE) MG tablet Take 325 mg by mouth daily with breakfast.    [provider]  Magnesium 250 MG TABS Take by mouth. 1 tablet meal once a day    [provider]  meloxicam (MOBIC) 15 MG tablet Take 15 mg by mouth daily. 06/23/19   [provider]  Multiple Vitamin (MULTIVITAMIN) tablet Take 1 tablet by mouth daily.    [provider]  POTASSIUM PO Take by mouth.    [provider]  pravastatin (PRAVACHOL) 40 MG tablet Take 1 tablet by mouth  daily 05/07/15   Jerline Pain, MD  terbinafine (LAMISIL) 250 MG tablet Take 1 tablet (250 mg total) by mouth daily. 02/17/22   Wallene Huh, DPM  zolpidem (AMBIEN) 10 MG tablet Take 1 tablet by mouth daily. 03/30/15   [provider]    Family History Family History  Problem Relation Age of Onset   Hyperlipidemia Mother        heart issues with 2 angioplasty and high cholesterol  Diabetes Father        poor circulation   Hypertension Father    Heart attack Father    Hypertension Sister    Colon cancer Maternal Uncle    Colon cancer Paternal Uncle        x 2 with colon cancer    Social History Social History   Tobacco Use   Smoking status: Never   Smokeless tobacco: Never  Substance Use Topics   Alcohol use: No   Drug use: No     Allergies   Elemental sulfur and Sulfa antibiotics   Review of Systems Review of Systems  Gastrointestinal:  Positive for nausea and vomiting.     Physical Exam Triage Vital Signs ED Triage Vitals [10/22/22 1437]  Enc Vitals Group     BP 112/77     Pulse Rate (!) 108     Resp 18     Temp 97.9 F (36.6 C)     Temp Source Oral     SpO2 98 %     Weight      Height      Head  Circumference      Peak Flow      Pain Score 2     Pain Loc      Pain Edu?      Excl. in [place]?    No data found.  Updated Vital Signs BP 112/77 (BP Location: Left Arm)   Pulse (!) 108   Temp 97.9 F (36.6 C) (Oral)   Resp 18   LMP  (LMP Unknown)   SpO2 98%   Visual Acuity Right Eye Distance:   Left Eye Distance:   Bilateral Distance:    Right Eye Near:   Left Eye Near:    Bilateral Near:     Physical Exam Vitals and nursing note reviewed.  Constitutional:      Appearance: Normal appearance.  HENT:     Head: Normocephalic and atraumatic.  Eyes:     Pupils: Pupils are equal, round, and reactive to light.  Cardiovascular:     Rate and Rhythm: Tachycardia present.     Comments: Initially tacky on intake but improved after Zofran Pulmonary:     Effort: Pulmonary effort is normal.  Abdominal:     General: Abdomen is flat. Bowel sounds are normal. There is no distension.     Palpations: Abdomen is soft.     Tenderness: There is no abdominal tenderness. There is no guarding or rebound.  Skin:    General: Skin is warm and dry.  Neurological:     General: No focal deficit present.     Mental Status: She is alert and oriented to person, place, and time.  Psychiatric:        Mood and Affect: Mood normal.        Behavior: Behavior normal.      UC Treatments / Results  Labs (all labs ordered are listed, but only abnormal results are displayed) Labs Reviewed - No data to display  EKG   Radiology No results found.  Procedures Procedures (including critical care time)  Medications Ordered in UC Medications  ondansetron (ZOFRAN-ODT) disintegrating tablet 4 mg (4 mg Oral Given 10/22/22 1500)    Initial Impression / Assessment and Plan / UC Course  I have reviewed the triage vital signs and the nursing notes.  Pertinent labs & imaging results that were available during my care of the patient were reviewed by me and considered in my  medical decision making (see  chart for details).  Clinical Course as of 10/22/22 1531  Wed Oct 22, 2022  1529 Heart rate recheck 90 [JM]    Clinical Course User Index [JM] Melynda Ripple, NP    Patient able to keep ginger ale down after Zofran in clinic. Discussed viral enteritis Zofran as needed nausea/vomiting Encouraged fluids and brat diet PCP follow-up if symptoms do not improve ER precautions reviewed and patient verbalized understanding Final Clinical Impressions(s) / UC Diagnoses   Final diagnoses:  Nausea and vomiting, unspecified vomiting type  Gastroenteritis     Discharge Instructions      Zofran as needed for nausea and vomiting Try to drink lots of fluids including Gatorade, Powerade, Pedialyte, water Rest Follow-up with your PCP if symptoms do not improve Please go to the emergency room if you have any worsening symptoms    ED Prescriptions     Medication Sig Dispense Auth. Provider   ondansetron (ZOFRAN-ODT) 4 MG disintegrating tablet Take 1 tablet (4 mg total) by mouth every 8 (eight) hours as needed for nausea or vomiting. 20 tablet Melynda Ripple, NP      PDMP not reviewed this encounter.   Melynda Ripple, NP 10/22/22 1531

## 2022-10-22 NOTE — ED Triage Notes (Signed)
Pt presents with c/o nausea and vomiting X midnight.  States she has been vomiting on and off, OTC nausea medicine did no work. She is unable to hold down liquids.

## 2022-10-22 NOTE — Discharge Instructions (Signed)
Zofran as needed for nausea and vomiting Try to drink lots of fluids including Gatorade, Powerade, Pedialyte, water Rest Follow-up with your PCP if symptoms do not improve Please go to the emergency room if you have any worsening symptoms

## 2022-11-14 ENCOUNTER — Ambulatory Visit
Admission: EM | Admit: 2022-11-14 | Discharge: 2022-11-14 | Disposition: A | Discharge status: Home / Self Care | Payer: 59 | Attending: Nurse Practitioner | Admitting: Nurse Practitioner

## 2022-11-14 DIAGNOSIS — L03011 Cellulitis of right finger: Secondary | ICD-10-CM | POA: Diagnosis not present

## 2022-11-14 MED ORDER — CEFTRIAXONE SODIUM 1 G IJ SOLR
1.0000 g | Freq: Once | INTRAMUSCULAR | Status: AC
  Administered 2022-11-14: 1 g via INTRAMUSCULAR

## 2022-11-14 MED ORDER — DOXYCYCLINE HYCLATE 100 MG PO CAPS: 100.0000 mg | ORAL_CAPSULE | Freq: Two times a day (BID) | ORAL | 0 refills | Status: AC

## 2022-11-14 NOTE — ED Triage Notes (Addendum)
Pt reports having right middle finger swelling and pain after getting pricked with rose thorn. There is redness and swelling to the area.  Started: yesterday   Home interventions: neosporin

## 2022-11-14 NOTE — Discharge Instructions (Signed)
Start doxycycline twice daily for 7 days Please follow-up with your PCP if your symptoms do not improve Please go to the ER for any worsening symptoms.  This includes was not limited to fever or chills, worsening swelling/redness/pain to the finger, or any new concerns that arise

## 2022-11-14 NOTE — ED Provider Notes (Signed)
[facility]    CSN: [id number] Arrival date & time: 11/14/22  1036      History   Chief Complaint Chief Complaint  Patient presents with   Hand Pain    HPI NANCEY Pugh is a 62 y.o. female presents for evaluation of finger infection.  Patient reports yesterday around 6 PM she was working in her garden when she excellently stuck herself on her lateral right distal third finger with a thorn.  She states she got all the thorn out and clean the area.  States there was mild redness at the site but when she woke this morning there was redness and swelling that is extended all the way to her PIP joint.  She states it is stiff and she is unable to move it.  No fevers or chills.  No history of MRSA.  She did put some Neosporin to the area.  No other concerns at this time.   Hand Pain    Past Medical History:  Diagnosis Date   Hyperlipidemia    Insomnia    Microscopic hematuria    complete work up neg. for malignancy 06/2011   Obstructive sleep apnea    mild    Vitamin D deficiency disease     Patient Active Problem List   Diagnosis Date Noted   HLD (hyperlipidemia) 04/05/2015   Hyperlipidemia 04/05/2015   PVC (premature ventricular contraction) 04/05/2015   Palpitation 04/05/2015   Insomnia 04/05/2015   Leg cramp 04/05/2015   Abdominal pain 07/07/2011   Chronic interstitial cystitis 07/07/2011   Pelvic and perineal pain 07/07/2011    Past Surgical History:  Procedure Laterality Date   CYST REMOVAL HAND     02/2000   PARTIAL HYSTERECTOMY      OB History   No obstetric history on file.      Home Medications    Prior to Admission medications   Medication Sig Start Date End Date Taking? Authorizing Provider  doxycycline (VIBRAMYCIN) 100 MG capsule Take 1 capsule (100 mg total) by mouth 2 (two) times daily for 7 days. 11/14/22 11/21/22 Yes Radford Pax, NP  calcium-vitamin D 250-100 MG-UNIT per tablet Take 1200 mg as directed    [provider]   clotrimazole-betamethasone (LOTRISONE) cream Apply 1 Application topically 2 (two) times daily. 02/17/22   Lenn Sink, DPM  Coenzyme Q10 (COQ10 PO) Take 1 tablet by mouth daily.    [provider]  Elderberry 500 MG CAPS     [provider]  ferrous sulfate 325 (65 FE) MG tablet Take 325 mg by mouth daily with breakfast.    [provider]  Magnesium 250 MG TABS Take by mouth. 1 tablet meal once a day    [provider]  meloxicam (MOBIC) 15 MG tablet Take 15 mg by mouth daily. 06/23/19   [provider]  Multiple Vitamin (MULTIVITAMIN) tablet Take 1 tablet by mouth daily.    [provider]  ondansetron (ZOFRAN-ODT) 4 MG disintegrating tablet Take 1 tablet (4 mg total) by mouth every 8 (eight) hours as needed for nausea or vomiting. 10/22/22   Radford Pax, NP  POTASSIUM PO Take by mouth.    [provider]  pravastatin (PRAVACHOL) 40 MG tablet Take 1 tablet by mouth  daily 05/07/15   Jake Bathe, MD  terbinafine (LAMISIL) 250 MG tablet Take 1 tablet (250 mg total) by mouth daily. 02/17/22   Lenn Sink, DPM  zolpidem (AMBIEN) 10 MG  tablet Take 1 tablet by mouth daily. 03/30/15   [provider]    Family History Family History  Problem Relation Age of Onset   Hyperlipidemia Mother        heart issues with 2 angioplasty and high cholesterol       Diabetes Father        poor circulation   Hypertension Father    Heart attack Father    Hypertension Sister    Colon cancer Maternal Uncle    Colon cancer Paternal Uncle        x 2 with colon cancer    Social History Social History   Tobacco Use   Smoking status: Never   Smokeless tobacco: Never  Substance Use Topics   Alcohol use: No   Drug use: No     Allergies   Elemental sulfur and Sulfa antibiotics   Review of Systems Review of Systems  Skin:        Right third finger infection     Physical Exam Triage Vital Signs ED Triage Vitals   Enc Vitals Group     BP 11/14/22 1059 121/81     Pulse Rate 11/14/22 1059 97     Resp 11/14/22 1059 16     Temp 11/14/22 1059 98.3 F (36.8 C)     Temp Source 11/14/22 1059 Oral     SpO2 11/14/22 1059 98 %     Weight --      Height --      Head Circumference --      Peak Flow --      Pain Score 11/14/22 1058 7     Pain Loc --      Pain Edu? --      Excl. in [place]? --    No data found.  Updated Vital Signs BP 121/81 (BP Location: Left Arm)   Pulse 97   Temp 98.3 F (36.8 C) (Oral)   Resp 16   LMP  (LMP Unknown)   SpO2 98%   Visual Acuity Right Eye Distance:   Left Eye Distance:   Bilateral Distance:    Right Eye Near:   Left Eye Near:    Bilateral Near:     Physical Exam Vitals and nursing note reviewed.  Constitutional:      Appearance: Normal appearance.  HENT:     Head: Normocephalic and atraumatic.  Eyes:     Pupils: Pupils are equal, round, and reactive to light.  Cardiovascular:     Rate and Rhythm: Normal rate.  Pulmonary:     Effort: Pulmonary effort is normal.  Musculoskeletal:       Hands:     Comments: There is moderate swelling with erythema and warmth that starts at the right third finger DIP joint and extends to the PIP joint.  No streaking.  No erythema or swelling of the hand or MCP joint.  Cap refill +2.  Limited flexion due to swelling.  Skin is intact with no palpable or visible FB  Skin:    General: Skin is warm and dry.  Neurological:     General: No focal deficit present.     Mental Status: She is alert and oriented to person, place, and time.  Psychiatric:        Mood and Affect: Mood normal.        Behavior: Behavior normal.      UC Treatments / Results  Labs (all labs ordered are listed, but only abnormal results  are displayed) Labs Reviewed - No data to display  EKG   Radiology No results found.  Procedures Procedures (including critical care time)  Medications Ordered in UC Medications  cefTRIAXone (ROCEPHIN)  injection 1 g (has no administration in time range)    Initial Impression / Assessment and Plan / UC Course  I have reviewed the triage vital signs and the nursing notes.  Pertinent labs & imaging results that were available during my care of the patient were reviewed by me and considered in my medical decision making (see chart for details).     Reviewed exam and symptoms with patient.  Wound occurred less than 24 hours ago and is already progressed to mid finger.  Given this ceftriaxone IM given in clinic.  Patient monitored for 10 minutes after injection with no reaction noted and she tolerated well Requested finger splint for comfort and this was applied by nursing staff Start doxycycline Follow-up with PCP if symptoms do not improve ER precautions reviewed and patient verbalized understanding  Final Clinical Impressions(s) / UC Diagnoses   Final diagnoses:  Cellulitis of right middle finger     Discharge Instructions      Start doxycycline twice daily for 7 days Please follow-up with your PCP if your symptoms do not improve Please go to the ER for any worsening symptoms.  This includes was not limited to fever or chills, worsening swelling/redness/pain to the finger, or any new concerns that arise     ED Prescriptions     Medication Sig Dispense Auth. Provider   doxycycline (VIBRAMYCIN) 100 MG capsule Take 1 capsule (100 mg total) by mouth 2 (two) times daily for 7 days. 14 capsule Radford PaxMayer, Jodi R, NP      PDMP not reviewed this encounter.   Radford PaxMayer, Jodi R, NP 11/14/22 [phone]

## 2023-11-22 ENCOUNTER — Ambulatory Visit
Admission: EM | Admit: 2023-11-22 | Discharge: 2023-11-22 | Disposition: A | Discharge status: Home / Self Care | Attending: Family Medicine | Admitting: Family Medicine

## 2023-11-22 DIAGNOSIS — W57XXXA Bitten or stung by nonvenomous insect and other nonvenomous arthropods, initial encounter: Secondary | ICD-10-CM

## 2023-11-22 DIAGNOSIS — A084 Viral intestinal infection, unspecified: Secondary | ICD-10-CM | POA: Diagnosis not present

## 2023-11-22 DIAGNOSIS — R112 Nausea with vomiting, unspecified: Secondary | ICD-10-CM

## 2023-11-22 MED ORDER — DICYCLOMINE HCL 20 MG PO TABS: 20.0000 mg | ORAL_TABLET | Freq: Two times a day (BID) | ORAL | 0 refills | Status: AC

## 2023-11-22 NOTE — ED Provider Notes (Signed)
 [facility]  Note:  This document was prepared using Conservation officer, historic buildings and may include unintentional dictation errors.  MRN: [id number] DOB: [birth year]  Subjective:   Jasmin Pugh is a 63 y.o. female presenting for 2-day history of acute onset nausea, vomiting, diarrhea.  Symptoms have improved today after she took Zofran.  No bloody stools.  No fever, recent antibiotic use, hospitalizations or long distance travel.  Has not eaten raw foods, drank unfiltered water.  No history of GI disorders including Crohn's, IBS, ulcerative colitis.  She also has concerns about suffering a tick bite 1 month ago.  Has not felt any particular symptoms until today.  Would like to be tested for this.  No current facility-administered medications for this encounter.  Current Outpatient Medications:    calcium-vitamin D 250-100 MG-UNIT per tablet, Take 1200 mg as directed, Disp: , Rfl:    clotrimazole-betamethasone (LOTRISONE) cream, Apply 1 Application topically 2 (two) times daily., Disp: 30 g, Rfl: 0   Coenzyme Q10 (COQ10 PO), Take 1 tablet by mouth daily., Disp: , Rfl:    Elderberry 500 MG CAPS, , Disp: , Rfl:    ferrous sulfate 325 (65 FE) MG tablet, Take 325 mg by mouth daily with breakfast., Disp: , Rfl:    Magnesium 250 MG TABS, Take by mouth. 1 tablet meal once a day, Disp: , Rfl:    meloxicam (MOBIC) 15 MG tablet, Take 15 mg by mouth daily., Disp: , Rfl:    Multiple Vitamin (MULTIVITAMIN) tablet, Take 1 tablet by mouth daily., Disp: , Rfl:    ondansetron (ZOFRAN-ODT) 4 MG disintegrating tablet, Take 1 tablet (4 mg total) by mouth every 8 (eight) hours as needed for nausea or vomiting., Disp: 20 tablet, Rfl: 0   POTASSIUM PO, Take by mouth., Disp: , Rfl:    pravastatin (PRAVACHOL) 40 MG tablet, Take 1 tablet by mouth  daily, Disp: 90 tablet, Rfl: 3   terbinafine (LAMISIL) 250 MG tablet, Take 1 tablet (250 mg total) by mouth daily., Disp: 45 tablet, Rfl: 0    zolpidem (AMBIEN) 10 MG tablet, Take 1 tablet by mouth daily., Disp: , Rfl:    Allergies  Allergen Reactions   Elemental Sulfur     Other reaction(s): Fever Hives, nausea, vomiting   Sulfa Antibiotics Rash    Past Medical History:  Diagnosis Date   Hyperlipidemia    Insomnia    Microscopic hematuria    complete work up neg. for malignancy 06/2011   Obstructive sleep apnea    mild    Vitamin D deficiency disease      Past Surgical History:  Procedure Laterality Date   CYST REMOVAL HAND     02/2000   PARTIAL HYSTERECTOMY      Family History  Problem Relation Age of Onset   Hyperlipidemia Mother        heart issues with 2 angioplasty and high cholesterol       Diabetes Father        poor circulation   Hypertension Father    Heart attack Father    Hypertension Sister    Colon cancer Maternal Uncle    Colon cancer Paternal Uncle        x 2 with colon cancer    Social History   Tobacco Use   Smoking status: Never   Smokeless tobacco: Never  Vaping Use   Vaping status: Never Used  Substance Use Topics   Alcohol use: Yes  Comment: occ    ROS   Objective:   Vitals: BP 112/74 (BP Location: Right Arm)   Pulse 78   Temp 98.3 F (36.8 C) (Oral)   Resp 20   LMP  (LMP Unknown)   SpO2 96%   Physical Exam Constitutional:      General: She is not in acute distress.    Appearance: Normal appearance. She is well-developed and normal weight. She is not ill-appearing, toxic-appearing or diaphoretic.  HENT:     Head: Normocephalic and atraumatic.     Right Ear: Tympanic membrane, ear canal and external ear normal. No drainage or tenderness. No middle ear effusion. There is no impacted cerumen. Tympanic membrane is not erythematous or bulging.     Left Ear: Tympanic membrane, ear canal and external ear normal. No drainage or tenderness.  No middle ear effusion. There is no impacted cerumen. Tympanic membrane is not erythematous or bulging.     Nose: Nose normal.  No congestion or rhinorrhea.     Mouth/Throat:     Mouth: Mucous membranes are moist. No oral lesions.     Pharynx: No pharyngeal swelling, oropharyngeal exudate, posterior oropharyngeal erythema or uvula swelling.     Tonsils: No tonsillar exudate or tonsillar abscesses.  Eyes:     General: No scleral icterus.       Right eye: No discharge.        Left eye: No discharge.     Extraocular Movements: Extraocular movements intact.     Right eye: Normal extraocular motion.     Left eye: Normal extraocular motion.     Conjunctiva/sclera: Conjunctivae normal.  Cardiovascular:     Rate and Rhythm: Normal rate and regular rhythm.     Heart sounds: Normal heart sounds. No murmur heard.    No friction rub. No gallop.  Pulmonary:     Effort: Pulmonary effort is normal. No respiratory distress.     Breath sounds: No stridor. No wheezing, rhonchi or rales.  Chest:     Chest wall: No tenderness.  Abdominal:     General: Bowel sounds are increased. There is no distension.     Palpations: Abdomen is soft. There is no mass.     Tenderness: There is generalized abdominal tenderness (mild throughout). There is no right CVA tenderness, left CVA tenderness, guarding or rebound.  Musculoskeletal:     Cervical back: Normal range of motion and neck supple.  Lymphadenopathy:     Cervical: No cervical adenopathy.  Skin:    General: Skin is warm and dry.     Findings: No rash.  Neurological:     General: No focal deficit present.     Mental Status: She is alert and oriented to person, place, and time.     Cranial Nerves: No cranial nerve deficit.     Motor: No weakness.     Coordination: Coordination normal.     Gait: Gait normal.     Deep Tendon Reflexes: Reflexes normal.  Psychiatric:        Mood and Affect: Mood normal.        Behavior: Behavior normal.        Thought Content: Thought content normal.        Judgment: Judgment normal.     Assessment and Plan :   PDMP not reviewed this  encounter.  1. Viral gastroenteritis   2. Nausea vomiting and diarrhea   3. Tick bite, unspecified site, initial encounter    Labs pending for  tickborne illness.  Will manage for suspected viral gastroenteritis with supportive care.  Recommended patient hydrate well, eat light meals and maintain electrolytes.  Will use Zofran and Imodium for nausea, vomiting and diarrhea. Counseled patient on potential for adverse effects with medications prescribed/recommended today, ER and return-to-clinic precautions discussed, patient verbalized understanding.    Adolph Hoop, [place] 11/22/23 1244

## 2023-11-22 NOTE — Discharge Instructions (Signed)
Make sure you push fluids drinking mostly water but mix it with Gatorade.  Try to eat light meals including soups, broths and soft foods, fruits.  You may use Zofran for your nausea and vomiting once every 8 hours.  Bentyl can help with diarrhea but use this carefully limiting it to 1-2 times per day only if you are having a lot of diarrhea.  Please return to the clinic if symptoms worsen or you start having severe abdominal pain not helped by taking Tylenol or start having bloody stools or blood in the vomit.  

## 2023-11-22 NOTE — ED Triage Notes (Signed)
 Pt c/o n/v/d started 2 days ago-felt better yesterday-worse this am at work-states she has concerns may be r/t a tick bite last month-NAD-steady gait

## 2023-11-27 LAB — EHRLICHIA ANTIBODY PANEL
E. Chaffeensis (HME) IgM Titer: NEGATIVE
E.Chaffeensis (HME) IgG: NEGATIVE
HGE IgG Titer: NEGATIVE
HGE IgM Titer: NEGATIVE

## 2023-11-27 LAB — LYME DISEASE SEROLOGY W/REFLEX: Lyme Total Antibody EIA: NEGATIVE

## 2023-11-27 LAB — SPOTTED FEVER GROUP ANTIBODIES
Spotted Fever Group IgG: <1:64 {titer}
Spotted Fever Group IgM: <1:64 {titer}

## 2024-03-09 ENCOUNTER — Encounter: Payer: Self-pay | Admitting: Emergency Medicine

## 2024-03-09 ENCOUNTER — Telehealth: Payer: Self-pay | Admitting: Physician Assistant

## 2024-03-09 ENCOUNTER — Ambulatory Visit: Admission: EM | Admit: 2024-03-09 | Discharge: 2024-03-09 | Disposition: A | Discharge status: Home / Self Care

## 2024-03-09 DIAGNOSIS — K219 Gastro-esophageal reflux disease without esophagitis: Secondary | ICD-10-CM | POA: Insufficient documentation

## 2024-03-09 DIAGNOSIS — N898 Other specified noninflammatory disorders of vagina: Secondary | ICD-10-CM | POA: Insufficient documentation

## 2024-03-09 DIAGNOSIS — H35039 Hypertensive retinopathy, unspecified eye: Secondary | ICD-10-CM | POA: Insufficient documentation

## 2024-03-09 DIAGNOSIS — J0100 Acute maxillary sinusitis, unspecified: Secondary | ICD-10-CM

## 2024-03-09 DIAGNOSIS — G4721 Circadian rhythm sleep disorder, delayed sleep phase type: Secondary | ICD-10-CM | POA: Insufficient documentation

## 2024-03-09 DIAGNOSIS — G4733 Obstructive sleep apnea (adult) (pediatric): Secondary | ICD-10-CM | POA: Insufficient documentation

## 2024-03-09 DIAGNOSIS — Z8249 Family history of ischemic heart disease and other diseases of the circulatory system: Secondary | ICD-10-CM | POA: Insufficient documentation

## 2024-03-09 DIAGNOSIS — N951 Menopausal and female climacteric states: Secondary | ICD-10-CM | POA: Insufficient documentation

## 2024-03-09 DIAGNOSIS — Z800 Family history of malignant neoplasm of digestive organs: Secondary | ICD-10-CM | POA: Insufficient documentation

## 2024-03-09 DIAGNOSIS — E559 Vitamin D deficiency, unspecified: Secondary | ICD-10-CM | POA: Insufficient documentation

## 2024-03-09 DIAGNOSIS — D509 Iron deficiency anemia, unspecified: Secondary | ICD-10-CM | POA: Insufficient documentation

## 2024-03-09 DIAGNOSIS — N952 Postmenopausal atrophic vaginitis: Secondary | ICD-10-CM | POA: Insufficient documentation

## 2024-03-09 MED ORDER — ONDANSETRON 4 MG PO TBDP: 4.0000 mg | ORAL_TABLET | Freq: Three times a day (TID) | ORAL | 0 refills | Status: AC | PRN

## 2024-03-09 MED ORDER — DOXYCYCLINE HYCLATE 100 MG PO CAPS: 100.0000 mg | ORAL_CAPSULE | Freq: Two times a day (BID) | ORAL | 0 refills | Status: AC

## 2024-03-09 MED ORDER — AMOXICILLIN-POT CLAVULANATE 875-125 MG PO TABS: 1.0000 | ORAL_TABLET | Freq: Two times a day (BID) | ORAL | 0 refills | Status: AC

## 2024-03-09 NOTE — Telephone Encounter (Signed)
 Patient reports for nausea and vomiting after taking 1 dose of amoxicillin .  She reports headache due to vomiting.  Headache is not severe. Will change antibiotic to doxycycline  and will prescribe zofran  for nausea. Encouraged evaluation in ED if headache worsens or symptoms do not improve.

## 2024-03-09 NOTE — ED Triage Notes (Signed)
 Pt presents c/o possible sinus infection x 6 days. Pt says nose is extremely runny and reports slight yellow snot with a little blood in it. Pt also reports facial pain behind her eyes and cheeks accompanied by a throbbing headache.

## 2024-03-11 NOTE — ED Provider Notes (Signed)
 [facility]    CSN: [id number] Arrival date & time: 03/09/24  1131      History   Chief Complaint Chief Complaint  Patient presents with   Possible Sinus Infection     HPI Jasmin Pugh is a 63 y.o. female.   Patient here today for evaluation of possible sinus infection.  She reports for 6 days she has had runny nose as well as facial pain behind her eyes and cheeks.  She reports that she has had some throbbing headache as well.  She notes her mucus has been yellow and has had some little blood in it at times.  She does not report fever.  The history is provided by the patient.    Past Medical History:  Diagnosis Date   Hyperlipidemia    Insomnia    Microscopic hematuria    complete work up neg. for malignancy 06/2011   Obstructive sleep apnea    mild    Vitamin D deficiency disease     Patient Active Problem List   Diagnosis Date Noted   Atrophy of vagina 03/09/2024   Circadian rhythm sleep disorder, delayed sleep phase type 03/09/2024   Family history of colon cancer 03/09/2024   Family history of ischemic heart disease (IHD) 03/09/2024   Gastro-esophageal reflux disease without esophagitis 03/09/2024   Hypertensive retinopathy 03/09/2024   Iron deficiency anemia 03/09/2024   Menopausal and female climacteric states 03/09/2024   Obstructive sleep apnea syndrome 03/09/2024   Vaginal dryness 03/09/2024   Vitamin D deficiency 03/09/2024   HLD (hyperlipidemia) 04/05/2015   Hyperlipidemia 04/05/2015   PVC (premature ventricular contraction) 04/05/2015   Palpitations 04/05/2015   Insomnia 04/05/2015   Leg cramp 04/05/2015   Abdominal pain 07/07/2011   Vulvodynia 07/07/2011   Pelvic and perineal pain 07/07/2011    Past Surgical History:  Procedure Laterality Date   CYST REMOVAL HAND     02/2000   PARTIAL HYSTERECTOMY      OB History   No obstetric history on file.      Home Medications    Prior to Admission medications   Medication  Sig Start Date End Date Taking? Authorizing Provider  amoxicillin -clavulanate (AUGMENTIN ) 875-125 MG tablet Take 1 tablet by mouth every 12 (twelve) hours. 03/09/24  Yes Billy Asberry FALCON, PA-C  metoprolol succinate (TOPROL-XL) 50 MG 24 hr tablet Take by mouth. 07/02/11  Yes [provider]  rosuvastatin (CRESTOR) 5 MG tablet Take by mouth. 07/02/11  Yes [provider]  calcium-vitamin D 250-100 MG-UNIT per tablet Take 1200 mg as directed    [provider]  Cholecalciferol (VITAMIN D3) 1000 units CAPS 1 capsule.    [provider]  clotrimazole -betamethasone  (LOTRISONE ) cream Apply 1 Application topically 2 (two) times daily. 02/17/22   Magdalen Pasco RAMAN, DPM  Coenzyme Q10 (COQ10 PO) Take 1 tablet by mouth daily.    [provider]  dicyclomine  (BENTYL ) 20 MG tablet Take 1 tablet (20 mg total) by mouth 2 (two) times daily. 11/22/23   Christopher Savannah, PA-C  doxycycline  (VIBRAMYCIN ) 100 MG capsule Take 1 capsule (100 mg total) by mouth 2 (two) times daily for 7 days. 03/09/24 03/16/24  Billy Asberry FALCON, PA-C  Elderberry 500 MG CAPS     [provider]  ferrous sulfate 325 (65 FE) MG tablet Take 325 mg by mouth daily with breakfast.    [provider]  Magnesium 250 MG TABS Take by mouth. 1 tablet meal once a day  [provider]  meloxicam (MOBIC) 15 MG tablet Take 15 mg by mouth daily. 06/23/19   [provider]  Multiple Vitamin (MULTIVITAMIN) tablet Take 1 tablet by mouth daily.    [provider]  ondansetron  (ZOFRAN -ODT) 4 MG disintegrating tablet Take 1 tablet (4 mg total) by mouth every 8 (eight) hours as needed. 03/09/24   Billy Asberry FALCON, PA-C  POTASSIUM PO Take by mouth.    [provider]  pravastatin  (PRAVACHOL ) 40 MG tablet Take 1 tablet by mouth  daily 05/07/15   Jeffrie Oneil BROCKS, MD  terbinafine  (LAMISIL ) 250 MG tablet Take 1 tablet (250 mg total) by mouth daily. 02/17/22   Magdalen Pasco RAMAN, DPM   zolpidem (AMBIEN) 10 MG tablet Take 1 tablet by mouth daily. 03/30/15   [provider]    Family History Family History  Problem Relation Age of Onset   Hyperlipidemia Mother        heart issues with 2 angioplasty and high cholesterol       Diabetes Father        poor circulation   Hypertension Father    Heart attack Father    Hypertension Sister    Colon cancer Maternal Uncle    Colon cancer Paternal Uncle        x 2 with colon cancer    Social History Social History   Tobacco Use   Smoking status: Never    Passive exposure: Never   Smokeless tobacco: Never  Vaping Use   Vaping status: Never Used  Substance Use Topics   Alcohol use: Yes    Comment: occ     Allergies   Elemental sulfur, Sulfamethoxazole, and Sulfa antibiotics   Review of Systems Review of Systems  Constitutional:  Negative for chills and fever.  HENT:  Positive for congestion and sinus pressure. Negative for ear pain.   Eyes:  Negative for discharge and redness.  Respiratory:  Negative for cough, shortness of breath and wheezing.   Gastrointestinal:  Negative for abdominal pain, diarrhea, nausea and vomiting.     Physical Exam Triage Vital Signs ED Triage Vitals  Encounter Vitals Group     BP 03/09/24 1232 110/74     Girls Systolic BP Percentile --      Girls Diastolic BP Percentile --      Boys Systolic BP Percentile --      Boys Diastolic BP Percentile --      Pulse Rate 03/09/24 1232 86     Resp 03/09/24 1232 18     Temp 03/09/24 1232 98.8 F (37.1 C)     Temp Source 03/09/24 1232 Oral     SpO2 03/09/24 1232 98 %     Weight 03/09/24 1231 135 lb (61.2 kg)     Height 03/09/24 1231 4' 11 (1.499 m)     Head Circumference --      Peak Flow --      Pain Score 03/09/24 1231 4     Pain Loc --      Pain Education --      Exclude from Growth Chart --    No data found.  Updated Vital Signs BP 110/74 (BP Location: Left Arm)   Pulse 86   Temp 98.8 F (37.1 C) (Oral)    Resp 18   Ht 4' 11 (1.499 m)   Wt 135 lb (61.2 kg)   LMP  (LMP Unknown)   SpO2 98%   BMI 27.27 kg/m   Visual  Acuity Right Eye Distance:   Left Eye Distance:   Bilateral Distance:    Right Eye Near:   Left Eye Near:    Bilateral Near:     Physical Exam Vitals and nursing note reviewed.  Constitutional:      General: She is not in acute distress.    Appearance: Normal appearance. She is not ill-appearing.  HENT:     Head: Normocephalic and atraumatic.     Right Ear: Tympanic membrane normal.     Left Ear: Tympanic membrane normal.     Nose: Congestion present.     Mouth/Throat:     Mouth: Mucous membranes are moist.     Pharynx: No oropharyngeal exudate or posterior oropharyngeal erythema.  Eyes:     Conjunctiva/sclera: Conjunctivae normal.  Cardiovascular:     Rate and Rhythm: Normal rate and regular rhythm.     Heart sounds: Normal heart sounds. No murmur heard. Pulmonary:     Effort: Pulmonary effort is normal. No respiratory distress.     Breath sounds: Normal breath sounds. No wheezing, rhonchi or rales.  Skin:    General: Skin is warm and dry.  Neurological:     Mental Status: She is alert.  Psychiatric:        Mood and Affect: Mood normal.        Thought Content: Thought content normal.      UC Treatments / Results  Labs (all labs ordered are listed, but only abnormal results are displayed) Labs Reviewed - No data to display  EKG   Radiology No results found.  Procedures Procedures (including critical care time)  Medications Ordered in UC Medications - No data to display  Initial Impression / Assessment and Plan / UC Course  I have reviewed the triage vital signs and the nursing notes.  Pertinent labs & imaging results that were available during my care of the patient were reviewed by me and considered in my medical decision making (see chart for details).    Will treat to cover sinusitis with Augmentin .  Advised follow-up if no gradual  improvement or with any further concerns.  Final Clinical Impressions(s) / UC Diagnoses   Final diagnoses:  Acute non-recurrent maxillary sinusitis   Discharge Instructions   None    ED Prescriptions     Medication Sig Dispense Auth. Provider   amoxicillin -clavulanate (AUGMENTIN ) 875-125 MG tablet Take 1 tablet by mouth every 12 (twelve) hours. 14 tablet Billy Asberry FALCON, PA-C      PDMP not reviewed this encounter.   Billy Asberry FALCON, PA-C 03/11/24 [phone]
